# Patient Record
Sex: Male | Born: 1970 | Hispanic: No | Marital: Married | Smoking: Current every day smoker
Health system: Southern US, Community
[De-identification: ages and names within clinical notes are randomized; demographics above are authoritative.]

## PROBLEM LIST (undated history)

## (undated) DIAGNOSIS — R21 Rash and other nonspecific skin eruption: Secondary | ICD-10-CM

## (undated) DIAGNOSIS — Z8669 Personal history of other diseases of the nervous system and sense organs: Secondary | ICD-10-CM

## (undated) DIAGNOSIS — I2510 Atherosclerotic heart disease of native coronary artery without angina pectoris: Secondary | ICD-10-CM

## (undated) DIAGNOSIS — R42 Dizziness and giddiness: Secondary | ICD-10-CM

## (undated) DIAGNOSIS — E785 Hyperlipidemia, unspecified: Secondary | ICD-10-CM

## (undated) DIAGNOSIS — R9430 Abnormal result of cardiovascular function study, unspecified: Secondary | ICD-10-CM

## (undated) DIAGNOSIS — IMO0002 Reserved for concepts with insufficient information to code with codable children: Secondary | ICD-10-CM

## (undated) DIAGNOSIS — F17200 Nicotine dependence, unspecified, uncomplicated: Secondary | ICD-10-CM

## (undated) DIAGNOSIS — E669 Obesity, unspecified: Secondary | ICD-10-CM

## (undated) DIAGNOSIS — M940 Chondrocostal junction syndrome [Tietze]: Secondary | ICD-10-CM

## (undated) DIAGNOSIS — I10 Essential (primary) hypertension: Secondary | ICD-10-CM

## (undated) HISTORY — DX: Obesity, unspecified: E66.9

## (undated) HISTORY — DX: Atherosclerotic heart disease of native coronary artery without angina pectoris: I25.10

## (undated) HISTORY — DX: Chondrocostal junction syndrome (tietze): M94.0

## (undated) HISTORY — DX: Rash and other nonspecific skin eruption: R21

## (undated) HISTORY — DX: Personal history of other diseases of the nervous system and sense organs: Z86.69

## (undated) HISTORY — DX: Nicotine dependence, unspecified, uncomplicated: F17.200

## (undated) HISTORY — DX: Reserved for concepts with insufficient information to code with codable children: IMO0002

## (undated) HISTORY — DX: Hyperlipidemia, unspecified: E78.5

## (undated) HISTORY — DX: Abnormal result of cardiovascular function study, unspecified: R94.30

## (undated) HISTORY — DX: Dizziness and giddiness: R42

## (undated) HISTORY — DX: Essential (primary) hypertension: I10

---

## 2007-01-27 ENCOUNTER — Ambulatory Visit: Payer: Self-pay | Admitting: Cardiology

## 2010-07-11 ENCOUNTER — Encounter: Payer: Self-pay | Admitting: Cardiology

## 2010-07-12 ENCOUNTER — Encounter: Payer: Self-pay | Admitting: Cardiology

## 2010-07-12 ENCOUNTER — Encounter: Payer: Self-pay | Admitting: Cardiovascular Disease

## 2010-07-12 ENCOUNTER — Inpatient Hospital Stay (HOSPITAL_COMMUNITY)
Admission: AD | Admit: 2010-07-12 | Discharge: 2010-07-14 | DRG: 249 | Disposition: A | Discharge status: Home / Self Care | Payer: Self-pay | Source: Other Acute Inpatient Hospital | Attending: Internal Medicine | Admitting: Internal Medicine

## 2010-07-12 DIAGNOSIS — I2510 Atherosclerotic heart disease of native coronary artery without angina pectoris: Secondary | ICD-10-CM

## 2010-07-12 DIAGNOSIS — I2582 Chronic total occlusion of coronary artery: Secondary | ICD-10-CM | POA: Diagnosis present

## 2010-07-12 DIAGNOSIS — I214 Non-ST elevation (NSTEMI) myocardial infarction: Secondary | ICD-10-CM

## 2010-07-12 DIAGNOSIS — Z7982 Long term (current) use of aspirin: Secondary | ICD-10-CM

## 2010-07-12 DIAGNOSIS — I10 Essential (primary) hypertension: Secondary | ICD-10-CM | POA: Diagnosis present

## 2010-07-12 DIAGNOSIS — E785 Hyperlipidemia, unspecified: Secondary | ICD-10-CM | POA: Diagnosis present

## 2010-07-12 DIAGNOSIS — Z7902 Long term (current) use of antithrombotics/antiplatelets: Secondary | ICD-10-CM

## 2010-07-12 DIAGNOSIS — E669 Obesity, unspecified: Secondary | ICD-10-CM | POA: Diagnosis present

## 2010-07-12 DIAGNOSIS — F17200 Nicotine dependence, unspecified, uncomplicated: Secondary | ICD-10-CM | POA: Diagnosis present

## 2010-07-12 HISTORY — DX: Atherosclerotic heart disease of native coronary artery without angina pectoris: I25.10

## 2010-07-13 ENCOUNTER — Encounter: Payer: Self-pay | Admitting: Cardiology

## 2010-07-13 DIAGNOSIS — I219 Acute myocardial infarction, unspecified: Secondary | ICD-10-CM

## 2010-07-13 HISTORY — PX: CORONARY ANGIOPLASTY: SHX604

## 2010-07-13 HISTORY — PX: CARDIAC CATHETERIZATION: SHX172

## 2010-07-13 LAB — CBC
HCT: 37.8 % — ABNORMAL LOW (ref 39.0–52.0)
MCV: 87.1 fL (ref 78.0–100.0)
RBC: 4.34 MIL/uL (ref 4.22–5.81)
WBC: 13.2 10*3/uL — ABNORMAL HIGH (ref 4.0–10.5)

## 2010-07-13 LAB — BASIC METABOLIC PANEL
BUN: 10 mg/dL (ref 6–23)
CO2: 25 mEq/L (ref 19–32)
Chloride: 105 mEq/L (ref 96–112)
Glucose, Bld: 124 mg/dL — ABNORMAL HIGH (ref 70–99)
Potassium: 3.8 mEq/L (ref 3.5–5.1)

## 2010-07-14 ENCOUNTER — Encounter: Payer: Self-pay | Admitting: Cardiovascular Disease

## 2010-07-14 ENCOUNTER — Encounter: Payer: Self-pay | Admitting: Cardiology

## 2010-07-14 DIAGNOSIS — I214 Non-ST elevation (NSTEMI) myocardial infarction: Secondary | ICD-10-CM

## 2010-07-14 LAB — LIPID PANEL
Cholesterol: 146 mg/dL (ref 0–200)
HDL: 30 mg/dL — ABNORMAL LOW (ref >39)
LDL Cholesterol: 93 mg/dL (ref 0–99)
Total CHOL/HDL Ratio: 4.9 RATIO
Triglycerides: 114 mg/dL (ref <150)
VLDL: 23 mg/dL (ref 0–40)

## 2010-07-14 LAB — TSH: TSH: 4.94 u[IU]/mL — ABNORMAL HIGH (ref 0.350–4.500)

## 2010-07-17 ENCOUNTER — Telehealth (INDEPENDENT_AMBULATORY_CARE_PROVIDER_SITE_OTHER): Payer: Self-pay | Admitting: *Deleted

## 2010-07-19 ENCOUNTER — Telehealth (INDEPENDENT_AMBULATORY_CARE_PROVIDER_SITE_OTHER): Payer: Self-pay | Admitting: *Deleted

## 2010-07-20 ENCOUNTER — Encounter (INDEPENDENT_AMBULATORY_CARE_PROVIDER_SITE_OTHER): Payer: Self-pay | Admitting: Cardiology

## 2010-07-20 ENCOUNTER — Encounter: Payer: Self-pay | Admitting: Cardiology

## 2010-07-20 DIAGNOSIS — Z9861 Coronary angioplasty status: Secondary | ICD-10-CM

## 2010-07-20 DIAGNOSIS — G4733 Obstructive sleep apnea (adult) (pediatric): Secondary | ICD-10-CM | POA: Insufficient documentation

## 2010-07-20 DIAGNOSIS — I2510 Atherosclerotic heart disease of native coronary artery without angina pectoris: Secondary | ICD-10-CM

## 2010-07-25 NOTE — Medication Information (Signed)
Summary: [facility] MEDICATION SHEET ORDER  [facility] MEDICATION SHEET ORDER   Imported By: Zachary George 07/20/2010 04:54:09  _____________________________________________________________________  External Attachment:    Type:   Image     Comment:   External Document

## 2010-07-25 NOTE — Progress Notes (Signed)
Summary: PHONE: RASH  Phone Note Call from Patient Call back at [phone]   Caller: WIFE Call For: nurse Reason for Call: Acute Illness Details for Reason: RASH Summary of Call: Patient has developed a rash late yesterday. Rash is on arms,back and chest. thinks it is medication but not sure. Initial call taken by: Zachary George,  July 19, 2010 2:09 PM  Follow-up for Phone Call        spoke with patient said he is having a rash little red bumps on right forearm,chest,&entire back. Took some benedryl and it clears until benedryl wears off. Rash started yesterday. forearms started itching Tuesday a little. Follow-up by: Carlye Grippe,  July 19, 2010 3:40 PM  Additional Follow-up for Phone Call Additional follow up Details #1::        I do not know this patient. Records indicate that he was discharged from [facility] and was to followup with Dr. Andee Lineman. He had a non-STEMI and underwent stent placement to the circumflex. I would be concerned that this could be a drug allergy, and he is on Plavix, presumably a new medication. I would have him seen tomorrow in clinic to sort this out. I spoke with Gene Serpe about the situation. Additional Follow-up by: Loreli Slot, MD, [facility],  July 19, 2010 4:34 PM   New Allergies: ! CODEINE Additional Follow-up for Phone Call Additional follow up Details #2::    appears that patient has been added to today's schedule. Follow-up by: Carlye Grippe,  July 20, 2010 8:14 AM  New/Updated Medications: PLAVIX 75 MG TABS (CLOPIDOGREL BISULFATE) Take 1 tablet by mouth once a day PRAVASTATIN SODIUM 40 MG TABS (PRAVASTATIN SODIUM) Take 1 tablet by mouth once a day at bedtime PEPCID AC MAXIMUM STRENGTH 20 MG TABS (FAMOTIDINE) Take 1 tablet by mouth once a day New Allergies: ! CODEINE

## 2010-07-25 NOTE — Cardiovascular Report (Signed)
Summary: Cardiac Catheterization  Cardiac Catheterization   Imported By: Dorise Hiss 07/20/2010 08:07:26  _____________________________________________________________________  External Attachment:    Type:   Image     Comment:   External Document

## 2010-07-25 NOTE — Consult Note (Signed)
Summary: Consultation Report/ CARDIOLOGY  Consultation Report/ CARDIOLOGY   Imported By: Dorise Hiss 07/20/2010 09:20:21  _____________________________________________________________________  External Attachment:    Type:   Image     Comment:   External Document

## 2010-07-25 NOTE — Letter (Addendum)
Summary: Discharge Summary  Discharge Summary   Imported By: Dorise Hiss 07/17/2010 16:13:22  _____________________________________________________________________  External Attachment:    Type:   Image     Comment:   External Document

## 2010-07-25 NOTE — Progress Notes (Signed)
Summary: Cath Dressing  Phone Note Call from Patient   Summary of Call: Pt's wife called stating he still have bluky, gauze dressing in place to groin and arm following cath on 2/9. He would like to know when these can be removed. Pt's wife notified pt can remove any gauze dressing covering cath insertion site at this time. Initial call taken by: Cyril Loosen, RN, BSN,  July 17, 2010 4:04 PM

## 2010-07-31 NOTE — Cardiovascular Report (Addendum)
Summary: [facility] Cardiac Cath   [facility] Cardiac Cath   Imported By: Earl Many 07/26/2010 15:49:47  _____________________________________________________________________  External Attachment:    Type:   Image     Comment:   External Document

## 2010-08-02 NOTE — Discharge Summary (Signed)
  NAMEMEDARDO, Alexander Shannon                ACCOUNT NO.:  [id number]  MEDICAL RECORD NO.:  [id number]           PATIENT TYPE:  LOCATION:                                 FACILITY:  PHYSICIAN:  Vesta Mixer, M.D. DATE OF BIRTH:  03/25/1971  DATE OF ADMISSION: DATE OF DISCHARGE:                              DISCHARGE SUMMARY   ADDENDUM  The patient's medications will be changed at discharge.  Instead of giving him Crestor to go home on, he will be given pravastatin 40 mg nightly, #30, with 5 refills due to cost.  He will need a followup lipid and LFT panel in about 8 weeks and this can be arranged in his followup visit at our office in [place].     Tereso Newcomer, PA-C   ______________________________ Vesta Mixer, M.D.    SW/MEDQ  D:  07/14/2010  T:  07/15/2010  Job:  [id number]  Electronically Signed by Tereso Newcomer PA-C on 07/30/2010 08:37:06 AM Electronically Signed by Kristeen Miss M.D. on 08/02/2010 06:15:55 PM

## 2010-08-02 NOTE — Discharge Summary (Signed)
Alexander Shannon, Alexander Shannon                ACCOUNT NO.:  [id number]  MEDICAL RECORD NO.:  [id number]           PATIENT TYPE:  I  LOCATION:  3701                         FACILITY:  [facility]  PHYSICIAN:  Vesta Mixer, M.D. DATE OF BIRTH:  December 29, 1970  DATE OF ADMISSION:  07/12/2010 DATE OF DISCHARGE:  07/14/2010                              DISCHARGE SUMMARY   CARDIOLOGIST:  Learta Codding, MD,FACC  ADMISSION DIAGNOSIS:  Non-ST-elevation myocardial infarction.  DISCHARGE DIAGNOSES: 1. Coronary artery disease.     a.     Status post bare-metal stent to the circumflex in the      setting of non-ST-elevation myocardial infarction this admission. 2. Preserved left ventricular function with an ejection fraction of 60-     65% with mild left ventricular hypertrophy on echocardiogram this     admission. 3. Dyslipidemia. 4. Hypertension. 5. Tobacco abuse. 6. Obesity. 7. Migraine headaches. 8. Family history of coronary disease.  PROCEDURES PERFORMED THIS ADMISSION:  Cardiac catheterization and percutaneous coronary intervention by Dr. Tonny Bollman July 12, 2010:  Proximal RCA 20-30%; posterolateral branch 50%; proximal LAD 30%, distal LAD, 50%; circumflex totally occluded in the mid vessel.  PCI: 3.0 x 18 mm Vision bare-metal stent placed in circumflex.  ALLERGIES:  CODEINE.  ADMISSION HISTORY:  Alexander Shannon is a 40 year old male who presented to [facility] on February 7 with complaints of chest and jaw pain. He was seen by cardiology and subsequently ruled in for myocardial infarction and was transferred to [facility] for further evaluation and treatment.  HOSPITAL COURSE:  The patient was transferred July 12, 2010.  He was seen by Dr. Excell Seltzer who performed cardiac catheterization, percutaneous coronary intervention.  The patient underwent bare-metal stenting to the circumflex.  There were no immediate complications.  Post procedure, Dr. Excell Seltzer recommended the  patient be on Plavix and aspirin for 12 months minimum.  Followup echocardiogram demonstrated normal LV function with an EF of 60-65%.  He was placed on Crestor and low-dose beta-blocker. He was seen by the tobacco cessation team and they recommended nicotine patch.  The patient ambulated with cardiac rehab without problems.  On July 14, 2010, he was found to be stable and was discharged to home in good condition.  LABS AND ANCILLARY DATA:  Echocardiogram as outlined above.  Hemoglobin 12.2, platelet count 245,000, potassium 3.8, creatinine 1.05, total cholesterol 146, triglycerides 114, HDL 30, LDL 93.  TSH 4.940. Abnormal TSH.  ACTIVITIES:  Increase activity slowly.  No driving for 1 week.  No lifting or sexual activity for 2 weeks.  He can return to work in 2 weeks on July 30, 2010.  He has been provided with a note.  DIET:  Low-fat, low-sodium diet.  WOUND CARE:  He is to call our office for any groin swelling, bleeding, bruising, or fever.  FOLLOWUP: 1. He is to see Dr. Andee Lineman in the 2 weeks in the [place] office.  The     office will contact with an appointment. 2. He will need a followup TSH and free T4 at his followup visit in 2  weeks.  It should be noted the patient listed Nexium upon admission.  However, he was taking his mother's prescription as needed.  He has been given instructions to take Pepcid 20 mg twice a day, over-the-counter as needed for indigestion, and to not take Nexium.  Total physician PA time greater than 30 minutes and was discharged.     Tereso Newcomer, PA-C   ______________________________ Vesta Mixer, M.D.    SW/MEDQ  D:  07/14/2010  T:  07/14/2010  Job:  [id number]  cc:   Learta Codding, MD,FACC  Electronically Signed by Tereso Newcomer PA-C on 07/30/2010 08:36:50 AM Electronically Signed by Kristeen Miss M.D. on 08/02/2010 06:15:53 PM

## 2010-08-03 ENCOUNTER — Telehealth (INDEPENDENT_AMBULATORY_CARE_PROVIDER_SITE_OTHER): Payer: Self-pay | Admitting: *Deleted

## 2010-08-08 ENCOUNTER — Encounter: Payer: Self-pay | Admitting: Cardiology

## 2010-08-09 NOTE — Procedures (Signed)
NAMERONON, FERGER                ACCOUNT NO.:  [id number]  MEDICAL RECORD NO.:  [id number]           PATIENT TYPE:  I  LOCATION:  2910                         FACILITY:  [facility]  PHYSICIAN:  Veverly Fells. Excell Seltzer, MD  DATE OF BIRTH:  1970-12-19  DATE OF PROCEDURE:  07/12/2010 DATE OF DISCHARGE:                           CARDIAC CATHETERIZATION   PROCEDURE: 1. Selective coronary angiography. 2. Percutaneous transluminal coronary angioplasty and stenting of the     left circumflex.  PROCEDURAL INDICATIONS:  Mr. Demario is a 40 year old gentleman who presented with non-ST-elevation infarction.  He had ongoing prolonged chest pain with elevated cardiac markers and was referred for PCI on an emergent basis.  Risks and indications of procedure were reviewed with the patient. Informed consent was obtained.  The right wrist was prepped, draped, and anesthetized with 1% lidocaine.  Using modified Seldinger technique, a 6- French sheath was placed in the right radial artery.  Weight-based unfractionated heparin was administered intravenously, 3 mg of verapamil was administered through the arterial sheath.  Coronary imaging was performed with standard Judkins catheters.  I had very difficult time imaging the left coronary artery.  Initially, I was able to take preliminary images with a 6-French XB LAD 3.5-cm guide catheter, but the catheter position was precarious.  The patient was found to have a totally occluded left circumflex and PCI was planned.  Bivalirudin was started for anticoagulation.  The patient had already been preloaded with Plavix.  I went through multiple guide catheters and never could get anything to fit into the left coronary artery from the right radial artery.  A prolonged attempt was made and eventually I thought the only way to treat this lesion successfully would be to convert to femoral access.  The femoral artery was accessed with a front wall puncture. The same  6-French XB LAD 3.5-cm guide catheter was used and it fit into the coronary well.  The totally occluded circumflex was crossed with a Cougar guidewire and reperfusion occurred with the wire crossing the lesion.  The vessel was predilated with a 2.5 x 15-mm apex balloon which was taken to 6 atmospheres.  The vessel was then stented with a 3.0 x 18- mm Vision bare-metal stent which was deployed at 14 atmospheres and appeared well expanded.  The stent was postdilated with a 3.5 x 15-mm [place] Quantum apex balloon which was taken to 14 atmospheres with an excellent result.  There was TIMI 3 flow and 0% residual stenosis postprocedure. Preprocedure, there was 100% stenosis and TIMI 0 flow.  The patient tolerated the procedure well.  Left ventriculography was deferred because of high contrast volume.  The femoral arteriotomy was closed with an Angio-Seal.  A TR band was used for radial artery hemostasis.  PROCEDURAL FINDINGS:  Right coronary artery:  This vessel was dominant. There is scattered plaque throughout.  There are no areas of high-grade stenosis noted, but there is 20-30% disease throughout the proximal and mid vessel.  Distally, the RCA divides into a PDA and a posterolateral branch.  The PDA is patent with minor irregularity.  The posterolateral branch has 50% stenosis  and appears to be diffusely diseased.  Left coronary artery:  The left main is patent.  It divides into the LAD and left circumflex.  LAD:  The LAD is patent throughout its course.  It wraps around the LV apex.  There is 30% proximal stenosis and 50% distal stenosis in the LAD.  The first diagonal branch is widely patent.  Left circumflex:  The left circumflex gives off 2 small obtuse marginal branches proximally.  The circumflex is then totally occluded in the midportion with TIMI 0 flow and no collateral filling.  After reopening the circumflex, it gives off a large OM branch and an OM-2 territory.  FINAL  ASSESSMENT: 1. Nonobstructive left anterior descending and right coronary artery     stenoses. 2. Total occlusion of the mid left circumflex with successful     percutaneous coronary intervention using a bare-metal stent     platform.  RECOMMENDATIONS:  The patient should continue on dual antiplatelet therapy with aspirin and Plavix for 12 months.  We will check a 2-D echo to assess his LV function.  He will require aggressive risk reduction measures.     Veverly Fells. Excell Seltzer, MD     MDC/MEDQ  D:  07/12/2010  T:  07/13/2010  Job:  [id number]  cc:   Learta Codding, MD,FACC  Electronically Signed by Tonny Bollman MD on 08/07/2010 08:56:01 PM

## 2010-08-14 NOTE — Progress Notes (Signed)
Summary: PEPCID [facility] INEFFECTIVE  Phone Note Call from Patient Call back at [phone]   Caller: Spouse Call For: doctor Summary of Call: Message left on nurses voicemail that pepcid ac is no longer working and patient is having increased heartburn and wanted to know if MD could recommend or change to something else. Initial call taken by: Carlye Grippe,  August 03, 2010 4:02 PM  Follow-up for Phone Call        change to Protonix 40mg  pO qdaily.  Follow-up by: Lewayne Bunting, MD, [facility],  August 07, 2010 6:24 AM  Additional Follow-up for Phone Call Additional follow up Details #1::        left message on machine to call office.  Additional Follow-up by: Carlye Grippe,  August 07, 2010 1:27 PM    Additional Follow-up for Phone Call Additional follow up Details #2::    Patient's wife informed of the above.  Follow-up by: Carlye Grippe,  August 08, 2010 8:24 AM  New/Updated Medications: PROTONIX 40 MG SOLR (PANTOPRAZOLE SODIUM) Take 1 tablet by mouth once a day Prescriptions: PROTONIX 40 MG SOLR (PANTOPRAZOLE SODIUM) Take 1 tablet by mouth once a day  #30 x 3   Entered by:   Carlye Grippe   Authorized by:   Lewayne Bunting, MD, [facility]   Signed by:   Carlye Grippe on 08/08/2010   Method used:   Electronically to        [facility]* (retail)       [address]       [place], [place]  [address]       Ph: [phone]       Fax: [phone]   RxID:   [id number]

## 2010-08-14 NOTE — Assessment & Plan Note (Signed)
Summary: per Dr. Jenetta Downer to have check with a rash that developed...   Visit Type:  Follow-up Primary Provider:  none  CC:  rash on arms and torso.  History of Present Illness: the patient had a recent stent placed for an acute coronary syndrome. He was placed on clopidogrel. However he has developed a rash. He seen in the office for this problem. The patient describes papular rash predominantly on the extremities in particular the upper arms. The rash is itchy. He has no other symptoms. He has no recurrent chest pain short of breath orthopnea PND.  Preventive Screening-Counseling & Management  Alcohol-Tobacco     Smoking Status: current     Smoking Cessation Counseling: yes     Packs/Day: 1/4 PPD  Current Medications (verified): 1)  Effient 10 Mg Tabs (Prasugrel Hcl) .... Take 1 Tablet By Mouth Once A Day 2)  Pravastatin Sodium 40 Mg Tabs (Pravastatin Sodium) .... Take 1 Tablet By Mouth Once A Day At Bedtime 3)  Pepcid Ac Maximum Strength 20 Mg Tabs (Famotidine) .... Take 1 Tablet By Mouth Once A Day 4)  Bisoprolol Fumarate 5 Mg Tabs (Bisoprolol Fumarate) .... Take 1/2 Tablet By Mouth Once A Day 5)  Aspir-Low 81 Mg Tbec (Aspirin) .... Take 1 Tablet By Mouth Once A Day 6)  Benadryl 25 Mg Tabs (Diphenhydramine Hcl) .... As Needed 7)  Effient 10 Mg Tabs (Prasugrel Hcl) .... Take 1 Tablet By Mouth Once A Day  (5 Bottles / 7 Tabs Each)  Allergies (verified): 1)  ! Codeine  Comments:  Nurse/Medical Assistant: The patient's medication bottles and allergies were reviewed with the patient and were updated in the Medication and Allergy Lists.  Past History:  Past Medical History: Last updated: 07/20/2010 1. Coronary artery disease.       a.     Status post bare-metal stent to the circumflex in the        setting of non-ST-elevation myocardial infarction this admission.   2. Preserved left ventricular function with an ejection fraction of 60-       65% with mild left ventricular  hypertrophy on echocardiogram this       admission.   3. Dyslipidemia.   4. Hypertension.   5. Tobacco abuse.   6. Obesity.   7. Migraine headaches.   8. Family history of coronary disease.      Family History: Last updated: 07/20/2010 Family hx of CAD Father had Coronary artery bypass grafting and Leriche syndrome  Social History: Last updated: 07/20/2010 drinks beer occasionally smokes cigarettes appr a pack a day stated smoking age 69  Risk Factors: Smoking Status: current (07/20/2010) Packs/Day: 1/4 PPD (07/20/2010)  Social History: Smoking Status:  current Packs/Day:  1/4 PPD  Review of Systems       The patient complains of rash.  The patient denies fatigue, malaise, fever, weight gain/loss, vision loss, decreased hearing, hoarseness, chest pain, palpitations, shortness of breath, prolonged cough, wheezing, sleep apnea, coughing up blood, abdominal pain, blood in stool, nausea, vomiting, diarrhea, heartburn, incontinence, blood in urine, muscle weakness, joint pain, leg swelling, skin lesions, headache, fainting, dizziness, depression, anxiety, enlarged lymph nodes, easy bruising or bleeding, and environmental allergies.    Vital Signs:  Patient profile:   40 year old male Height:      66 inches Weight:      303 pounds BMI:     49.08 Pulse rate:   62 / minute BP sitting:   104 / 73  (left  arm) Cuff size:   large  Vitals Entered By: Carlye Grippe (July 20, 2010 11:43 AM)  Nutrition Counseling: Patient's BMI is greater than 25 and therefore counseled on weight management options. CC: rash on arms and torso   Physical Exam  Additional Exam:  General: Well-developed, well-nourished in no distress head: Normocephalic and atraumatic eyes PERRLA/EOMI intact, conjunctiva and lids normal nose: No deformity or lesions mouth normal dentition, normal posterior pharynx neck: Supple, no JVD.  No masses, thyromegaly or abnormal cervical nodes lungs: Normal breath  sounds bilaterally without wheezing.  Normal percussion heart: regular rate and rhythm with normal S1 and S2, no S3 or S4.  PMI is normal.  No pathological murmurs abdomen: Normal bowel sounds, abdomen is soft and nontender without masses, organomegaly or hernias noted.  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 Doing well. His updated medication list for this problem includes:    Effient 10 Mg Tabs (Prasugrel hcl) .Marland Kitchen... Take 1 tablet by mouth once a day    Bisoprolol Fumarate 5 Mg Tabs (Bisoprolol fumarate) .Marland Kitchen... Take 1/2 tablet by mouth once a day    Aspir-low 81 Mg Tbec (Aspirin) .Marland Kitchen... Take 1 tablet by mouth once a day    Effient 10 Mg Tabs (Prasugrel hcl) .Marland Kitchen... Take 1 tablet by mouth once a day  (5 bottles / 7 tabs each)  Problem # 2:  SKIN RASH (ICD-782.1) the patient appears allergic to Plavix. We will change him to prasugrel. He will be started on 10 mg p.o. q. daily. The patient is to call back if he has any further problems.  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) Assessment: Comment Only  Patient Instructions: 1)  Stop Plavix 2)  Begin Effient 10mg  daily 3)  Follow up in  1 month Prescriptions: EFFIENT 10 MG TABS (PRASUGREL HCL) Take 1 tablet by mouth once a day  (5 bottles / 7 tabs each)  #35 x 0   Entered by:   Hoover Brunette, LPN   Authorized by:   Lewayne Bunting, MD, [facility]   Signed by:   Lewayne Bunting, MD, [facility] on 07/20/2010   Method used:   Samples Given   RxID:   [id number] EFFIENT 10 MG TABS (PRASUGREL  HCL) Take 1 tablet by mouth once a day  #30 x 6   Entered by:   Hoover Brunette, LPN   Authorized by:   Lewayne Bunting, MD, [facility]   Signed by:   Hoover Brunette, LPN on 14/78/2956   Method used:   Print then Give to Patient   RxID:   [id number]   Handout requested.

## 2010-08-31 ENCOUNTER — Encounter: Payer: Self-pay | Admitting: Cardiology

## 2010-09-03 ENCOUNTER — Encounter: Payer: Self-pay | Admitting: Cardiology

## 2010-11-07 ENCOUNTER — Encounter: Payer: Self-pay | Admitting: Cardiology

## 2010-11-15 ENCOUNTER — Encounter: Payer: Self-pay | Admitting: Cardiovascular Disease

## 2010-11-15 ENCOUNTER — Ambulatory Visit (INDEPENDENT_AMBULATORY_CARE_PROVIDER_SITE_OTHER): Payer: Self-pay | Admitting: Cardiovascular Disease

## 2010-11-15 DIAGNOSIS — F17200 Nicotine dependence, unspecified, uncomplicated: Secondary | ICD-10-CM

## 2010-11-15 DIAGNOSIS — E785 Hyperlipidemia, unspecified: Secondary | ICD-10-CM

## 2010-11-15 DIAGNOSIS — Z720 Tobacco use: Secondary | ICD-10-CM | POA: Insufficient documentation

## 2010-11-15 DIAGNOSIS — I2510 Atherosclerotic heart disease of native coronary artery without angina pectoris: Secondary | ICD-10-CM

## 2010-11-15 NOTE — Assessment & Plan Note (Signed)
Continue pravastatin 40 mg once daily. This was chosen due to cost reasons. He did have his lipid profile checked with his primary care physician recently and was told that it was fine. These results are not available. The goal LDL is less than 70.

## 2010-11-15 NOTE — Assessment & Plan Note (Signed)
The patient is doing well at this time with no clear symptoms of angina, heart failure or arrhythmia. The plan is to continue dual antiplatelet therapy for one year until February of 2013. At that time, Effient can likely be stopped. Continue aspirin daily indefinitely. I had a prolonged discussion with him about the importance of lifestyle changes including diet and regular exercise.

## 2010-11-15 NOTE — Assessment & Plan Note (Signed)
I had a prolonged discussion with the patient about the importance of smoking cessation. Unfortunately, he does not seem to be determined at this time to quit. Would continue to discuss this with him up on followup.

## 2010-11-15 NOTE — Patient Instructions (Signed)
Your physician wants you to follow-up in: 6 months. You will receive a reminder letter in the mail one-two months in advance. If you don't receive a letter, please call our office to schedule the follow-up appointment. Your physician recommends that you continue on your current medications as directed. Please refer to the Current Medication list given to you today. Your physician discussed the hazards of tobacco use. Tobacco use cessation is recommended and techniques and options to help you quit were discussed. 

## 2010-11-15 NOTE — Progress Notes (Signed)
HPI  This is a 40 year old man who is here today for a followup visit. 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 Cardiac catheterization showed an occluded mid left circumflex. He underwent an angioplasty and a bare-metal stent placement without complications. He was placed on Plavix. However, subsequently he came with a rash which was thought to be due to Plavix. This was switched to Effient 10 mg once daily. His rash has resolved completely. He has been doing reasonably well. He gets rare episodes of left-sided chest aching without radiation which happens mostly at night at rest. He does not have any exertional symptoms. He is very active at work. Unfortunately, he continues to smoke he has been taking all his other medications.   Allergies  Allergen Reactions  . Codeine      Current Outpatient Prescriptions on File Prior to Visit  Medication Sig Dispense Refill  . aspirin 81 MG tablet Take 81 mg by mouth daily.        . bisoprolol (ZEBETA) 5 MG tablet Take 1/2 by mouth daily       . diphenhydrAMINE (BENADRYL) 25 MG tablet Take 25 mg by mouth every 6 (six) hours as needed.        . prasugrel (EFFIENT) 10 MG TABS Take by mouth daily.        . pravastatin (PRAVACHOL) 40 MG tablet Take 40 mg by mouth daily.        Marland Kitchen DISCONTD: pantoprazole (PROTONIX) 40 MG tablet Take 40 mg by mouth daily.           Past Medical History  Diagnosis Date  . Chest pain, unspecified   . Tobacco use disorder   . Obesity, unspecified   . Tietze's disease   . History of migraine headaches     Takes excedrin  . Dyslipidemia   . Essential hypertension, benign   . CAD (coronary artery disease) 07/12/2010        CARDIAC CATHETERIZATION,  Status post bare-metal stent to the circumflex   . Hyperlipidemia      Past Surgical History  Procedure Date  . Cardiac catheterization 07/13/2010    Occludded mid LCX, LAD: 30% proximal and 50% distal, RCA 30%. Normal EF by echo  . Coronary angioplasty 07/13/2010    Mid LCX: 3.0 X 18 mm Vision BME     Family History  Problem Relation Age of Onset  . Coronary artery disease Other     family history      History   Social History  . Marital Status: Single    Spouse Name: N/A    Number of Children: N/A  . Years of Education: N/A   Occupational History  . Not on file.   Social History Main Topics  . Smoking status: Current Everyday Smoker -- 0.5 packs/day for 25 years    Types: Cigarettes  . Smokeless tobacco: Never Used   Comment: SMOKING CESSATION COUNSELING Started smoking age 78   . Alcohol Use: 0.0 oz/week     drinks beer on occasion  . Drug Use: Not on file  . Sexually Active: Not on file   Other Topics Concern  . Not on file   Social History Narrative  . No narrative on file       PHYSICAL EXAM   BP 128/78  Pulse 54  Ht 5\' 6"  (1.676 m)  Wt 287 lb (130.182 kg)  BMI 46.32 kg/m2  Constitutional: He is oriented to person, place, and time. He appears well-developed  and well-nourished. No distress.  HENT: No nasal discharge.  Head: Normocephalic and atraumatic.  Eyes: Pupils are equal, round, and reactive to light. Right eye exhibits no discharge. Left eye exhibits no discharge.  Neck: Normal range of motion. Neck supple. No JVD present. No thyromegaly present.  Cardiovascular: Normal rate, regular rhythm, normal heart sounds and intact distal pulses. Exam reveals no gallop and no friction rub.  No murmur heard.  Pulmonary/Chest: Effort normal and breath sounds normal. No stridor. No respiratory distress. He has no wheezes. He has no rales. He exhibits no tenderness.  Abdominal: Soft. Bowel sounds are normal. He exhibits no distension. There is no tenderness. There is no rebound and no guarding.  Musculoskeletal: Normal range of motion. He exhibits no edema and no tenderness.  Neurological: He is alert and oriented to person, place, and time. Coordination normal.  Skin: Skin is warm and dry. No rash noted. He is not  diaphoretic. No erythema. No pallor.  Psychiatric: He has a normal mood and affect. His behavior is normal. Judgment and thought content normal.        ASSESSMENT AND PLAN

## 2011-01-07 ENCOUNTER — Telehealth: Payer: Self-pay | Admitting: Nurse Practitioner

## 2011-01-07 NOTE — Telephone Encounter (Signed)
Pt called this evening stating that yesterday he did a fair amount of yard work, including weed whacking for about an hour.  Today, he's noted some lower chest 'soreness' and tenderness.  This discomfort is worse w/ palpation as well as position changes like lifting his arms above his head.  He says it feels like a sore muscle and is nothing like his prior angina. He has had no associated Ss and was able to work 9hrs today w/o any difficulty.  I advised that over the phone it is difficult to accurately assess chest pain but that based on his description, his Ss sound msk in origin.  He's going to take OTC ibuprofen to see if this helps.  If not, or if Ss worsen, he will either call back in am or present to [facility] ER.

## 2011-02-15 ENCOUNTER — Other Ambulatory Visit: Payer: Self-pay | Admitting: Physician Assistant

## 2011-04-19 ENCOUNTER — Encounter: Payer: Self-pay | Admitting: Cardiology

## 2011-04-20 ENCOUNTER — Other Ambulatory Visit: Payer: Self-pay | Admitting: Physician Assistant

## 2011-06-10 ENCOUNTER — Encounter: Payer: Self-pay | Admitting: Cardiology

## 2011-06-10 ENCOUNTER — Ambulatory Visit (INDEPENDENT_AMBULATORY_CARE_PROVIDER_SITE_OTHER): Payer: Self-pay | Admitting: Physician Assistant

## 2011-06-10 DIAGNOSIS — Z720 Tobacco use: Secondary | ICD-10-CM

## 2011-06-10 DIAGNOSIS — I2510 Atherosclerotic heart disease of native coronary artery without angina pectoris: Secondary | ICD-10-CM

## 2011-06-10 DIAGNOSIS — F17200 Nicotine dependence, unspecified, uncomplicated: Secondary | ICD-10-CM

## 2011-06-10 DIAGNOSIS — E785 Hyperlipidemia, unspecified: Secondary | ICD-10-CM

## 2011-06-10 NOTE — Progress Notes (Signed)
HPI: Patient presents for scheduled follow up.  Since last OV with Dr Kirke Corin in June, pt denies symptoms suggestive of UAP. He is compliant with his meds, including Effient, works regularly, and has lost 9 lbs. Unfortunately, however, he continues to smoke.  He has some mild DOE, and also reports occasional brief episodes of CP, which are unpredictable in onset. He denies symptoms reminiscent of those which preceded his MI in February 2012.  Allergies  Allergen Reactions  . Codeine   . Plavix (Clopidogrel Bisulfate) Rash    Current Outpatient Prescriptions  Medication Sig Dispense Refill  . aspirin 81 MG tablet Take 81 mg by mouth daily.        . bisoprolol (ZEBETA) 5 MG tablet TAKE 1/2 TABLET BY MOUTH DAILY  15 tablet  6  . celecoxib (CELEBREX) 200 MG capsule Take 200 mg by mouth daily.        . prasugrel (EFFIENT) 10 MG TABS Take by mouth daily.        . pravastatin (PRAVACHOL) 40 MG tablet TAKE 1 TABLET BY MOUTH AT BEDTIME  30 tablet  6    Past Medical History  Diagnosis Date  . Chest pain, unspecified   . Tobacco use disorder   . Obesity, unspecified   . Tietze's disease   . History of migraine headaches     Takes excedrin  . Dyslipidemia   . Essential hypertension, benign   . CAD (coronary artery disease) 07/12/2010        CARDIAC CATHETERIZATION,  Status post bare-metal stent to the circumflex   . Hyperlipidemia     History   Social History  . Marital Status: Single    Spouse Name: N/A    Number of Children: N/A  . Years of Education: N/A   Occupational History  . Not on file.   Social History Main Topics  . Smoking status: Current Everyday Smoker -- 0.5 packs/day for 25 years    Types: Cigarettes  . 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 Alcohol Use: 0.0 oz/week     drinks beer on occasion  . Drug Use: Not on file  . Sexually Active: Not on file   Other Topics Concern  . Not on file   Social  History Narrative  . No narrative on file    Family History  Problem Relation Age of Onset  . Coronary artery disease Other     family history     ROS: no nausea, vomiting; no fever, chills; no melena, hematochezia; no claudication  PHYSICAL EXAM:  BP 138/86  Pulse 68  Ht 5\' 6"  (1.676 m)  Wt 278 lb 12.8 oz (126.463 kg)  BMI 45.00 kg/m2 GENERAL: 40 yom, obese, sitting upright; NAD HEENT: NCAT, PERRLA, EOMI; sclera clear; no xanthelasma NECK: palpable bilateral carotid pulses, no bruits; no JVD; no TM LUNGS: CTA bilaterally CARDIAC: RRR (S1, S2); no significant murmurs; no rubs or gallops ABDOMEN: protuberant EXTREMETIES: intact distal pulses; no significant peripheral edema SKIN: warm/dry; no obvious rash/lesions MUSCULOSKELETAL: no joint deformity NEURO: no focal deficit; NL affect   EKG:    ASSESSMENT & PLAN:

## 2011-06-10 NOTE — Patient Instructions (Signed)
Your physician wants you to follow-up in: 6 months. You will receive a reminder letter in the mail one-two months in advance. If you don't receive a letter, please call our office to schedule the follow-up appointment. Your physician recommends that you continue on your current medications as directed. Please refer to the Current Medication list given to you today. Your physician discussed the hazards of tobacco use. Tobacco use cessation is recommended and techniques and options to help you quit were discussed. CALL 1-800-QUIT-NOW Your physician discussed the importance of regular exercise and recommended that you start or continue a regular exercise program for good health.

## 2011-06-10 NOTE — Assessment & Plan Note (Signed)
Continue current medication regimen, including Effient. We'll reassess clinical status in 6 months. Patient encouraged to exercise regularly, and to quit smoking.

## 2011-06-10 NOTE — Assessment & Plan Note (Signed)
We'll request most recent lipid profile results from his primary care provider, at [facility]. Reemphasized the importance of maintaining LDL target 70 or less, if feasible. We'll continue current dose of Pravachol, pending review of these results.

## 2011-06-10 NOTE — Assessment & Plan Note (Signed)
Reemphasized the importance of smoking cessation. Provided patient with 1 [phone] QUIT NOW number, for assistance.

## 2011-06-12 ENCOUNTER — Other Ambulatory Visit: Payer: Self-pay | Admitting: *Deleted

## 2011-06-12 ENCOUNTER — Ambulatory Visit: Payer: Self-pay | Admitting: Cardiology

## 2011-06-12 MED ORDER — PRAVASTATIN SODIUM 80 MG PO TABS: 80.0000 mg | ORAL_TABLET | Freq: Every day | ORAL | Status: DC

## 2011-12-16 ENCOUNTER — Ambulatory Visit: Payer: Self-pay | Admitting: Cardiology

## 2012-01-10 ENCOUNTER — Encounter: Payer: Self-pay | Admitting: *Deleted

## 2012-01-10 ENCOUNTER — Encounter: Payer: Self-pay | Admitting: Cardiovascular Disease

## 2012-01-10 ENCOUNTER — Ambulatory Visit (INDEPENDENT_AMBULATORY_CARE_PROVIDER_SITE_OTHER): Payer: BC Managed Care – PPO | Admitting: Cardiovascular Disease

## 2012-01-10 VITALS — BP 112/70 | HR 69 | Ht 66.0 in | Wt 281.0 lb

## 2012-01-10 DIAGNOSIS — E785 Hyperlipidemia, unspecified: Secondary | ICD-10-CM

## 2012-01-10 DIAGNOSIS — I2510 Atherosclerotic heart disease of native coronary artery without angina pectoris: Secondary | ICD-10-CM

## 2012-01-10 DIAGNOSIS — F17200 Nicotine dependence, unspecified, uncomplicated: Secondary | ICD-10-CM

## 2012-01-10 DIAGNOSIS — R079 Chest pain, unspecified: Secondary | ICD-10-CM

## 2012-01-10 NOTE — Assessment & Plan Note (Signed)
Continues to have periods of chest pain with exertion.  Still smoking  Stress myovue  If nonischemic stop Effient

## 2012-01-10 NOTE — Progress Notes (Signed)
Patient ID: Alexander Shannon, male   DOB: June 13, 1970, 41 y.o.   MRN: [id number] This is a 41 year old man who is here today for a followup visit. 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 Cardiac catheterization showed an occluded mid left circumflex. He underwent an angioplasty and a bare-metal stent placement without complications. He was placed on Plavix. However, subsequently he came with a rash which was thought to be due to Plavix. This was switched to Effient 10 mg once daily. His rash has resolved completely. He has been doing reasonably well.  He gets some SSCP with heavy exertion mosty going up stairs multiple times.  Apparently he had some mild SSCP when he last saw Dr Kirke Corin and this is why Effient not stopped   Unfortunately, he continues to smoke he has been taking all his other medications.    ROS: Denies fever, malais, weight loss, blurry vision, decreased visual acuity, cough, sputum, SOB, hemoptysis, pleuritic pain, palpitaitons, heartburn, abdominal pain, melena, lower extremity edema, claudication, or rash.  All other systems reviewed and negative  General: Affect appropriate Obese white male HEENT: normal Neck supple with no adenopathy JVP normal no bruits no thyromegaly Lungs clear with no wheezing and good diaphragmatic motion Heart:  S1/S2 no murmur, no rub, gallop or click PMI normal Abdomen: benighn, BS positve, no tenderness, no AAA no bruit.  No HSM or HJR Distal pulses intact with no bruits No edema Neuro non-focal Skin warm and dry tatoos on arm and sholders No muscular weakness   Current Outpatient Prescriptions  Medication Sig Dispense Refill  . aspirin 81 MG tablet Take 81 mg by mouth daily.        . bisoprolol (ZEBETA) 5 MG tablet TAKE 1/2 TABLET BY MOUTH DAILY  15 tablet  6  . prasugrel (EFFIENT) 10 MG TABS Take by mouth daily.        . pravastatin (PRAVACHOL) 80 MG tablet Take 1 tablet (80 mg total) by mouth daily.  30 tablet  6     Allergies  Plavix  Electrocardiogram:  NSR rate 69 poor R wave progression  Assessment and Plan

## 2012-01-10 NOTE — Assessment & Plan Note (Signed)
Discussed use of e cig and nicorette gum  Risk of recurrent vascular disease and MI discussed

## 2012-01-10 NOTE — Assessment & Plan Note (Signed)
Cholesterol is at goal.  Continue current dose of statin and diet Rx.  No myalgias or side effects.  F/U  LFT's in 6 months. Lab Results  Component Value Date   LDLCALC  Value: 9        Total Cholesterol/HDL:CHD Risk Coronary Heart Disease Risk Table                     Men   Women  1/2 Average Risk   3.4   3.3  Average Risk       5.0   4.4  2 X Average Risk   9.6   7.1  3 X Average Risk  23.4   11.0        Use the calculated Patient Ratio above and the CHD Risk Table to determine the patient's CHD Risk.        ATP III CLASSIFICATION (LDL):  <100     mg/dL   Optimal  161-096  mg/dL   Near or Above                    Optimal  130-159  mg/dL   Borderline  045-409  mg/dL   High  >811     mg/dL   Very High 02/15/7828

## 2012-01-10 NOTE — Patient Instructions (Addendum)
Your physician has requested that you have an exercise stress myoview. For further information please visit [email or web address] Please follow instruction sheet, as given.  Your physician recommends that you schedule a follow-up appointment with Dr. Andee Lineman after stress test.

## 2012-01-13 ENCOUNTER — Encounter (HOSPITAL_COMMUNITY): Payer: Self-pay | Admitting: Cardiology

## 2012-01-14 ENCOUNTER — Other Ambulatory Visit: Payer: Self-pay | Admitting: *Deleted

## 2012-01-14 DIAGNOSIS — R079 Chest pain, unspecified: Secondary | ICD-10-CM

## 2012-01-15 ENCOUNTER — Encounter (HOSPITAL_COMMUNITY): Payer: Self-pay

## 2012-01-15 ENCOUNTER — Encounter (HOSPITAL_COMMUNITY)
Admission: RE | Admit: 2012-01-15 | Discharge: 2012-01-15 | Disposition: A | Discharge status: Home / Self Care | Payer: BC Managed Care – PPO | Source: Ambulatory Visit | Attending: Cardiovascular Disease | Admitting: Cardiovascular Disease

## 2012-01-15 ENCOUNTER — Ambulatory Visit (INDEPENDENT_AMBULATORY_CARE_PROVIDER_SITE_OTHER): Payer: BC Managed Care – PPO

## 2012-01-15 DIAGNOSIS — R079 Chest pain, unspecified: Secondary | ICD-10-CM

## 2012-01-15 DIAGNOSIS — I2510 Atherosclerotic heart disease of native coronary artery without angina pectoris: Secondary | ICD-10-CM

## 2012-01-15 MED ORDER — TECHNETIUM TC 99M TETROFOSMIN IV KIT
30.0000 | PACK | Freq: Once | INTRAVENOUS | Status: AC | PRN
  Administered 2012-01-15: 30.5 via INTRAVENOUS

## 2012-01-15 MED ORDER — TECHNETIUM TC 99M TETROFOSMIN IV KIT
10.0000 | PACK | Freq: Once | INTRAVENOUS | Status: AC | PRN
  Administered 2012-01-15: 10 via INTRAVENOUS

## 2012-01-15 NOTE — Progress Notes (Signed)
Stress Lab Nurses Notes - Eytan Carrigan Weathers 01/15/2012 Reason for doing test: CAD and Chest Pain Type of test: Stress Myoview Nurse performing test: Parke Poisson, RN Nuclear Medicine Tech: Lyndel Pleasure Echo Tech: Not Applicable MD performing test: R. Rothbart & Herma Carson PA Family MD: NPCP Test explained and consent signed: yes IV started: 22g jelco, Saline lock flushed, No redness or edema and Saline lock started in radiology Symptoms:  Fatigue in legs Treatment/Intervention: None Reason test stopped: fatigue After recovery IV was: Discontinued via X-ray tech and No redness or edema Patient to return to Nuc. Med at :  11:45 Patient discharged: Home Patient's Condition upon discharge was: stable Comments: During test BP 182/80 & HR 160.  Recovery BP 132/62 & HR 85.  Symptoms resolved in recovery. Erskine Speed T

## 2012-01-27 ENCOUNTER — Ambulatory Visit: Payer: BC Managed Care – PPO | Admitting: Cardiology

## 2012-02-17 ENCOUNTER — Ambulatory Visit (INDEPENDENT_AMBULATORY_CARE_PROVIDER_SITE_OTHER): Payer: BC Managed Care – PPO | Admitting: Cardiology

## 2012-02-17 ENCOUNTER — Telehealth: Payer: Self-pay | Admitting: *Deleted

## 2012-02-17 ENCOUNTER — Encounter: Payer: Self-pay | Admitting: Cardiology

## 2012-02-17 ENCOUNTER — Encounter: Payer: Self-pay | Admitting: *Deleted

## 2012-02-17 VITALS — BP 112/74 | HR 62 | Ht 66.0 in | Wt 271.8 lb

## 2012-02-17 DIAGNOSIS — E785 Hyperlipidemia, unspecified: Secondary | ICD-10-CM | POA: Insufficient documentation

## 2012-02-17 DIAGNOSIS — IMO0002 Reserved for concepts with insufficient information to code with codable children: Secondary | ICD-10-CM

## 2012-02-17 DIAGNOSIS — R0989 Other specified symptoms and signs involving the circulatory and respiratory systems: Secondary | ICD-10-CM

## 2012-02-17 DIAGNOSIS — R42 Dizziness and giddiness: Secondary | ICD-10-CM

## 2012-02-17 DIAGNOSIS — R21 Rash and other nonspecific skin eruption: Secondary | ICD-10-CM | POA: Insufficient documentation

## 2012-02-17 DIAGNOSIS — I10 Essential (primary) hypertension: Secondary | ICD-10-CM

## 2012-02-17 DIAGNOSIS — R9430 Abnormal result of cardiovascular function study, unspecified: Secondary | ICD-10-CM

## 2012-02-17 DIAGNOSIS — I2510 Atherosclerotic heart disease of native coronary artery without angina pectoris: Secondary | ICD-10-CM

## 2012-02-17 DIAGNOSIS — Z720 Tobacco use: Secondary | ICD-10-CM

## 2012-02-17 DIAGNOSIS — F17200 Nicotine dependence, unspecified, uncomplicated: Secondary | ICD-10-CM

## 2012-02-17 MED ORDER — MECLIZINE HCL 25 MG PO TABS: 25.0000 mg | ORAL_TABLET | ORAL | Status: DC | PRN

## 2012-02-17 NOTE — Assessment & Plan Note (Signed)
Blood pressure is controlled. No change in therapy. 

## 2012-02-17 NOTE — Assessment & Plan Note (Signed)
The patient was added on today for a symptom complex including some dizziness that I believe his vertigo. He had some mild discomfort in the back of his neck which is nonspecific. He also had a headache that is improved. I am recommending to him that he use low-dose meclizine for probable vertigo if he has significant symptoms. I do believe that he is stable to try to return to work tomorrow.  I made sure he understood the physician staffing. He has decided that he would like to stay with our practice. I will follow him in the [place] office.

## 2012-02-17 NOTE — Assessment & Plan Note (Signed)
Coronary disease is stable. I feel that his recent symptoms are not ischemic in origin. No further workup.

## 2012-02-17 NOTE — Telephone Encounter (Signed)
Patient notified

## 2012-02-17 NOTE — Patient Instructions (Addendum)
   Meclizine 25mg  as needed for dizziness Your physician wants you to follow up in: 6 months.  You will receive a reminder letter in the mail one-two months in advance.  If you don't receive a letter, please call our office to schedule the follow up appointment

## 2012-02-17 NOTE — Telephone Encounter (Signed)
OK to stop Effient.

## 2012-02-17 NOTE — Progress Notes (Signed)
HPI  The patient is seen today as an add-on new patient for me. He has seen Dr. Earnestine Leys in the past. More recently he was seen by Dr. Eden Emms in the regional office. At that time he had some chest discomfort and plans were made for nuclear exercise test. The study was done and showed no ischemia. Ejection fraction was 65%. He does have known coronary disease. He received a stent in February, 2012. As part of today's evaluation I have reviewed all of the records and updated the electronic medical record completely.  Several days ago the patient had some discomfort in the back of his neck. There was some slight radiation to the shoulders and possibly to the anterior chest. This symptom was not the same as his ischemic symptoms. When he had ischemia he had lower jaw pain and significant indigestion,  slight radiation to the back, And some tingling in the left arm. He has not had any of these symptoms. Also since a few days ago he's had some mild dizziness. It is worse with head movement. It sounds like vertigo.  Allergies  Allergen Reactions  . Plavix (Clopidogrel Bisulfate) Rash    Current Outpatient Prescriptions  Medication Sig Dispense Refill  . aspirin 81 MG tablet Take 81 mg by mouth daily.        . bisoprolol (ZEBETA) 5 MG tablet TAKE 1/2 TABLET BY MOUTH DAILY  15 tablet  6  . prasugrel (EFFIENT) 10 MG TABS Take by mouth daily.        . pravastatin (PRAVACHOL) 80 MG tablet Take 1 tablet (80 mg total) by mouth daily.  30 tablet  6    History   Social History  . Marital Status: Single    Spouse Name: N/A    Number of Children: N/A  . Years of Education: N/A   Occupational History  . Not on file.   Social History Main Topics  . Smoking status: Current Every Day Smoker -- 0.5 packs/day for 25 years    Types: Cigarettes  . Smokeless tobacco: Never Used   Comment: SMOKING CESSATION COUNSELING Started smoking age 26   . Alcohol Use: 0.0 oz/week     drinks beer on occasion  . Drug  Use: Not on file  . Sexually Active: Not on file   Other Topics Concern  . Not on file   Social History Narrative  . No narrative on file    Family History  Problem Relation Age of Onset  . Coronary artery disease Other     family history     Past Medical History  Diagnosis Date  . Tobacco use disorder   . Obesity, unspecified   . Tietze's disease   . History of migraine headaches     Takes excedrin  . Dyslipidemia   . Essential hypertension, benign   . CAD (coronary artery disease) 07/12/2010        CARDIAC CATHETERIZATION,  Status post bare-metal stent to the circumflex   . Rash     Plavix, February, 2012  . Ejection fraction     EF 60-65%, echo, February, 2012  //   EF 60%, nuclear, July, 2013    Past Surgical History  Procedure Date  . Cardiac catheterization 07/13/2010    Occludded mid LCX, LAD: 30% proximal and 50% distal, RCA 30%. Normal EF by echo  . Coronary angioplasty 07/13/2010    Mid LCX: 3.0 X 18 mm Vision BME    ROS  Patient denies fever, chills, headache, sweats, rash, change in vision, change in hearing, cough, nausea vomiting, urinary symptoms. All other systems are reviewed and are negative.  PHYSICAL EXAM   Patient is overweight. There is no jugulovenous distention. Lungs are clear. Respiratory effort is nonlabored. Cardiac exam reveals S1 and S2. There no clicks or significant murmurs. The abdomen is soft. There is no peripheral edema.There no musculoskeletal deformities. There are no skin rashes.  Filed Vitals:   02/17/12 1038  BP: 112/74  Pulse: 62  Height: 5\' 6"  (1.676 m)  Weight: 271 lb 12.8 oz (123.288 kg)  SpO2: 98%   EKG is done today and reviewed by me. I've compared to his prior tracing. There is no significant change. He has slight decreased R wave anteriorly.  ASSESSMENT & PLAN

## 2012-02-17 NOTE — Telephone Encounter (Signed)
Patient questions if he can stop his Effient.  OV with Dr. Myrtis Ser this morning, forgot to ask.  Had OV with Dr. Eden Emms in [place] on 8/9.  Stress test was ordered & patient states he was told by MD that if okay he could come off this medication.  Stress test done on 8/13 - per Dr. Eden Emms (see test results) - normal myoview with no evidence of ischemia or infarction.   Informed patient of above & told him probably will be okay to stop, but would need to make sure with Dr. Myrtis Ser first.  Patient verbalized understanding.

## 2012-02-17 NOTE — Assessment & Plan Note (Signed)
Patient continues to smoke. I have counseled him to stop. 

## 2012-02-26 ENCOUNTER — Encounter: Payer: Self-pay | Admitting: Cardiology

## 2012-02-26 DIAGNOSIS — R079 Chest pain, unspecified: Secondary | ICD-10-CM

## 2012-02-27 ENCOUNTER — Encounter: Payer: Self-pay | Admitting: Cardiology

## 2012-03-05 ENCOUNTER — Ambulatory Visit: Payer: BC Managed Care – PPO | Admitting: Cardiology

## 2012-05-29 ENCOUNTER — Other Ambulatory Visit: Payer: Self-pay | Admitting: Physician Assistant

## 2012-07-23 ENCOUNTER — Other Ambulatory Visit: Payer: Self-pay | Admitting: Cardiology

## 2012-09-14 ENCOUNTER — Telehealth: Payer: Self-pay | Admitting: Physician Assistant

## 2012-09-14 NOTE — Telephone Encounter (Signed)
Chest pain & cold sweats with headache

## 2012-09-14 NOTE — Telephone Encounter (Signed)
Patient c/o of having a soreness in his chest that started 2 days ago rated 3-4 on a scale of 1-10 (10 being the greatest). Patient also c/o of having chills and a slight tightness in both jaws. No c/o dizziness or sob. Nurse informed patient that he did need to be evaluated and that he can either call his family doctor to be seen, or go to ED for evaluation. Nurse informed patient that if his PCP thinks he need to be seen very soon, we can have this arranged at one of our other clinics. Patient verbalized understanding of plan.

## 2013-02-16 ENCOUNTER — Ambulatory Visit (INDEPENDENT_AMBULATORY_CARE_PROVIDER_SITE_OTHER): Payer: BC Managed Care – PPO | Admitting: Cardiology

## 2013-02-16 ENCOUNTER — Encounter (HOSPITAL_COMMUNITY): Payer: Self-pay | Admitting: Pharmacy Technician

## 2013-02-16 ENCOUNTER — Encounter: Payer: Self-pay | Admitting: Cardiology

## 2013-02-16 ENCOUNTER — Inpatient Hospital Stay (HOSPITAL_COMMUNITY)
Admission: AD | Admit: 2013-02-16 | Payer: Self-pay | Source: Other Acute Inpatient Hospital | Admitting: Cardiovascular Disease

## 2013-02-16 VITALS — BP 132/84 | HR 66 | Ht 66.0 in | Wt 282.0 lb

## 2013-02-16 DIAGNOSIS — I2510 Atherosclerotic heart disease of native coronary artery without angina pectoris: Secondary | ICD-10-CM

## 2013-02-16 DIAGNOSIS — R079 Chest pain, unspecified: Secondary | ICD-10-CM

## 2013-02-16 NOTE — Patient Instructions (Addendum)
Patient is going to [facility] to rule out chest pain.

## 2013-02-16 NOTE — Progress Notes (Signed)
Clinical Summary Mr. Corkins is a 42 y.o.male  1. CAD/Chest pain:  - prior stent Feb 2012 to LCX - recent negative nuclear exercise stress - reports some chest pain in left chest to midchest. Feels a tightness that started Thursday. Tightness 5/10, can occur at rest or w/ exertion. Lasts anywhere from a few minutes to 30 minutes. Can go as long as an hour. No SOB, no nausea, no diaphoresis. Nothing makes better or worst. Does not have NG at home. Reports increase in severity and frequency over last few days. Can radiate to jaw, which similar to prior angina.   Past Medical History  Diagnosis Date  . Tobacco use disorder   . Obesity, unspecified   . Tietze's disease   . History of migraine headaches     Takes excedrin  . Dyslipidemia   . Essential hypertension, benign   . CAD (coronary artery disease) 07/12/2010        CARDIAC CATHETERIZATION,  Status post bare-metal stent to the circumflex   . Rash     Plavix, February, 2012  . Ejection fraction     EF 60-65%, echo, February, 2012  //   EF 60%, nuclear, July, 2013  . Dizziness     September, 2013, probable vertigo   Cardiac catheterization  07/13/2010  Occludded mid LCX, LAD: 30% proximal and 50% distal, RCA 30%. Normal EF by echo  .  Coronary angioplasty  07/13/2010  Mid LCX: 3.0 X 18 mm Vision BME    Allergies  Allergen Reactions  . Plavix [Clopidogrel Bisulfate] Rash     Current Outpatient Prescriptions  Medication Sig Dispense Refill  . aspirin 81 MG tablet Take 81 mg by mouth daily.        . bisoprolol (ZEBETA) 5 MG tablet TAKE 1/2 TABLET BY MOUTH DAILY  30 tablet  6  . carisoprodol (SOMA) 350 MG tablet Take 350 mg by mouth 3 (three) times daily as needed for muscle spasms.      Marland Kitchen HYDROcodone-acetaminophen (NORCO/VICODIN) 5-325 MG per tablet Take 1 tablet by mouth every 4 (four) hours as needed for pain.      . pravastatin (PRAVACHOL) 80 MG tablet Take 80 mg by mouth daily.       No current  facility-administered medications for this visit.     Past Surgical History  Procedure Laterality Date  . Cardiac catheterization  07/13/2010    Occludded mid LCX, LAD: 30% proximal and 50% distal, RCA 30%. Normal EF by echo  . Coronary angioplasty  07/13/2010    Mid LCX: 3.0 X 18 mm Vision BME     Allergies  Allergen Reactions  . Plavix [Clopidogrel Bisulfate] Rash      Family History  Problem Relation Age of Onset  . Coronary artery disease Other     family history      Social History Mr. Basto reports that he has been smoking Cigarettes.  He has a 12.5 pack-year smoking history. He has never used smokeless tobacco. Mr. Plaia reports that  drinks alcohol.   Review of Systems 12 point ROS negative other than reported in HPI  Physical Examination Filed Vitals:   02/16/13 1153  BP: 132/84  Pulse: 66   Filed Weights   02/16/13 1153  Weight: 282 lb (127.914 kg)    Gen: resting comfortably, NAD HEENT: no scleral icterus, pupils equal round and reactive, no palptable cervical adenopathy CV Pulm: CTAB Abd: soft, NT, ND NABS, no hepatosplenomegaly Ext: warm, no edema.  Skin: warm, no rash Neuro: A&Ox3, no focal deficits    Diagnostic Studies 01/2012 Exc Nuclear: 10 minutes, 10.4 METs, 89% THR, no perfusion defects.  02/16/13 EKG: SR, rate 75, normal axis, LAE, no LVH, no ischemic changes  Assessment and Plan  1. Chest pain: mixed story for cardiac pain, concerning that it is accelerating in frequency and severity, and involves some components similar to his prior angina. EKG shows no acute ischemic changes currently, he is hemodynamically stable - will send patient to [facility] ER for serial EKG and cardiac enzymes   Antoine Poche, M.D., F.A.C.C.

## 2013-02-17 ENCOUNTER — Encounter (HOSPITAL_COMMUNITY): Admission: RE | Payer: Self-pay | Source: Ambulatory Visit

## 2013-02-17 ENCOUNTER — Ambulatory Visit (HOSPITAL_COMMUNITY)
Admission: RE | Admit: 2013-02-17 | Payer: BC Managed Care – PPO | Source: Ambulatory Visit | Admitting: Cardiovascular Disease

## 2013-02-17 SURGERY — LEFT HEART CATHETERIZATION WITH CORONARY ANGIOGRAM

## 2013-03-31 ENCOUNTER — Other Ambulatory Visit: Payer: Self-pay | Admitting: Cardiology

## 2013-03-31 MED ORDER — PRAVASTATIN SODIUM 80 MG PO TABS: 80.0000 mg | ORAL_TABLET | Freq: Every day | ORAL | Status: DC

## 2013-06-28 ENCOUNTER — Other Ambulatory Visit: Payer: Self-pay

## 2013-06-28 MED ORDER — BISOPROLOL FUMARATE 5 MG PO TABS: 2.5000 mg | ORAL_TABLET | Freq: Every day | ORAL | Status: DC

## 2013-07-13 ENCOUNTER — Other Ambulatory Visit: Payer: Self-pay | Admitting: Cardiovascular Disease

## 2013-07-23 ENCOUNTER — Other Ambulatory Visit: Payer: Self-pay | Admitting: Cardiovascular Disease

## 2013-08-06 ENCOUNTER — Ambulatory Visit: Payer: BC Managed Care – PPO | Admitting: Cardiology

## 2013-08-26 ENCOUNTER — Encounter: Payer: Self-pay | Admitting: *Deleted

## 2013-08-26 ENCOUNTER — Encounter: Payer: Self-pay | Admitting: Cardiology

## 2013-08-26 ENCOUNTER — Telehealth: Payer: Self-pay | Admitting: Cardiology

## 2013-08-26 ENCOUNTER — Encounter (HOSPITAL_COMMUNITY): Payer: Self-pay | Admitting: Pharmacy Technician

## 2013-08-26 ENCOUNTER — Ambulatory Visit (INDEPENDENT_AMBULATORY_CARE_PROVIDER_SITE_OTHER): Payer: BC Managed Care – PPO | Admitting: Cardiology

## 2013-08-26 VITALS — BP 124/79 | HR 52 | Ht 66.0 in | Wt 215.0 lb

## 2013-08-26 DIAGNOSIS — I2510 Atherosclerotic heart disease of native coronary artery without angina pectoris: Secondary | ICD-10-CM

## 2013-08-26 DIAGNOSIS — E785 Hyperlipidemia, unspecified: Secondary | ICD-10-CM

## 2013-08-26 DIAGNOSIS — R079 Chest pain, unspecified: Secondary | ICD-10-CM

## 2013-08-26 MED ORDER — AMLODIPINE BESYLATE 5 MG PO TABS: 5.0000 mg | ORAL_TABLET | Freq: Every day | ORAL | Status: DC

## 2013-08-26 MED ORDER — NITROGLYCERIN 0.4 MG SL SUBL: 0.4000 mg | SUBLINGUAL_TABLET | SUBLINGUAL | Status: AC | PRN

## 2013-08-26 NOTE — Telephone Encounter (Signed)
Pt has [employer].  No precert required for heart cath

## 2013-08-26 NOTE — Telephone Encounter (Signed)
No precert required 

## 2013-08-26 NOTE — Progress Notes (Signed)
Clinical Summary Alexander Shannon is a 42 y.o.male seen today for follow up of the following medical problems.  1. CAD/Chest pain:  - prior stent Feb 2012 to LCX  - negative nuclear exercise stress in 2013 - reports some chest pain in left chest to midchest. Feels a tightness 5/10, can occur at rest or w/ exertion. Lasts anywhere from a few minutes to 30 minutes. Can go as long as an hour. No SOB, no nausea, no diaphoresis. Nothing makes better or worst. Does not have NG at home.Can radiate to jaw, which is similar to prior angina.  - described symptoms at last visit, was referred to [facility] ER with negative workup, recs were for transfer to [facility] however he left AMA  - since last visit has continued to have some occasional chest discomfort across chest and jaw, lasting just a few minutes. Stable in frequency, stable in severity.    Past Medical History  Diagnosis Date  . Tobacco use disorder   . Obesity, unspecified   . Tietze's disease   . History of migraine headaches     Takes excedrin  . Dyslipidemia   . Essential hypertension, benign   . CAD (coronary artery disease) 07/12/2010        CARDIAC CATHETERIZATION,  Status post bare-metal stent to the circumflex   . Rash     Plavix, February, 2012  . Ejection fraction     EF 60-65%, echo, February, 2012  //   EF 60%, nuclear, July, 2013  . Dizziness     September, 2013, probable vertigo     Allergies  Allergen Reactions  . Plavix [Clopidogrel Bisulfate] Rash     Current Outpatient Prescriptions  Medication Sig Dispense Refill  . aspirin 81 MG tablet Take 81 mg by mouth daily.        . bisoprolol (ZEBETA) 5 MG tablet TAKE 1/2 TABLET BY MOUTH DAILY  30 tablet  6  . carisoprodol (SOMA) 350 MG tablet Take 350 mg by mouth 3 (three) times daily as needed for muscle spasms.      Marland Kitchen HYDROcodone-acetaminophen (NORCO/VICODIN) 5-325 MG per tablet Take 1 tablet by mouth every 4 (four) hours as needed for pain.      . pravastatin  (PRAVACHOL) 80 MG tablet Take 1 tablet (80 mg total) by mouth daily.  30 tablet  6   No current facility-administered medications for this visit.     Past Surgical History  Procedure Laterality Date  . Cardiac catheterization  07/13/2010    Occludded mid LCX, LAD: 30% proximal and 50% distal, RCA 30%. Normal EF by echo  . Coronary angioplasty  07/13/2010    Mid LCX: 3.0 X 18 mm Vision BME     Allergies  Allergen Reactions  . Plavix [Clopidogrel Bisulfate] Rash      Family History  Problem Relation Age of Onset  . Coronary artery disease Other     family history      Social History Alexander Shannon reports that he has been smoking Cigarettes.  He has a 12.5 pack-year smoking history. He has never used smokeless tobacco. Alexander Shannon reports that he drinks alcohol.   Review of Systems CONSTITUTIONAL: No weight Shannon, fever, chills, weakness or fatigue.  HEENT: Eyes: No visual Shannon, blurred vision, double vision or yellow sclerae.No hearing Shannon, sneezing, congestion, runny nose or sore throat.  SKIN: No rash or itching.  CARDIOVASCULAR: per HPI RESPIRATORY: No shortness of breath, cough or sputum.  GASTROINTESTINAL: No  anorexia, nausea, vomiting or diarrhea. No abdominal pain or blood.  GENITOURINARY: No burning on urination, no polyuria NEUROLOGICAL: No headache, dizziness, syncope, paralysis, ataxia, numbness or tingling in the extremities. No change in bowel or bladder control.  MUSCULOSKELETAL: No muscle, back pain, joint pain or stiffness.  LYMPHATICS: No enlarged nodes. No history of splenectomy.  PSYCHIATRIC: No history of depression or anxiety.  ENDOCRINOLOGIC: No reports of sweating, cold or heat intolerance. No polyuria or polydipsia.  Marland Kitchen.   Physical Examination p 52 bp 124/79 Wt 215 lbs BMI 35 Gen: resting comfortably, no acute distress HEENT: no scleral icterus, pupils equal round and reactive, no palptable cervical adenopathy,  CV: rate 55, regular, no m/r/g,no  JVD Resp: Clear to auscultation bilaterally GI: abdomen is soft, non-tender, non-distended, normal bowel sounds, no hepatosplenomegaly MSK: extremities are warm, no edema.  Skin: warm, no rash Neuro:  no focal deficits Psych: appropriate affect   Diagnostic Studies 01/2012 Exc Nuclear: 10.4 METs, 89% THR, no perfusion defects.   02/16/13 EKG: SR, rate 75, normal axis, LAE, no LVH, no ischemic changes     Assessment and Plan  1. Chest pain: mixed story for cardiac pain, does involve some components similar to his prior angina. Concerning that episodes can occur at rest as well.[facility] - given CAD history and suggestive symptoms, [facility]. Will start norvasc as additional antianginal, along with prn nitroglycerin  2. Hyperlipidemia - repeat fasting lipid panel - continue current statin  Follow up after cath    Antoine PocheJonathan F. Dayshon Roback, M.D., F.A.C.C.

## 2013-08-26 NOTE — Telephone Encounter (Signed)
Left heart cath - 4/1 - 11:30 - Alexander Shannon  CHECKING PERCERT

## 2013-08-26 NOTE — Patient Instructions (Signed)
   Begin Norvasc 5mg  daily   Begin Nitroglycerin as needed for severe chest pain only   Above medications have been sent to pharmacy.   Your physician has requested that you have a cardiac catheterization. Cardiac catheterization is used to diagnose and/or treat various heart conditions. Doctors may recommend this procedure for a number of different reasons. The most common reason is to evaluate chest pain. Chest pain can be a symptom of coronary artery disease (CAD), and cardiac catheterization can show whether plaque is narrowing or blocking your heart's arteries. This procedure is also used to evaluate the valves, as well as measure the blood flow and oxygen levels in different parts of your heart. For further information please visit [email or web address]. Please follow instruction sheet, as given. Follow up will be given after procedure.

## 2013-09-01 ENCOUNTER — Ambulatory Visit (HOSPITAL_COMMUNITY)
Admission: RE | Admit: 2013-09-01 | Discharge: 2013-09-01 | Disposition: A | Discharge status: Home / Self Care | Payer: BC Managed Care – PPO | Source: Ambulatory Visit | Attending: Cardiology | Admitting: Cardiology

## 2013-09-01 ENCOUNTER — Encounter (HOSPITAL_COMMUNITY)
Admission: RE | Disposition: A | Discharge status: Home / Self Care | Payer: Self-pay | Source: Ambulatory Visit | Attending: Cardiology

## 2013-09-01 DIAGNOSIS — F17200 Nicotine dependence, unspecified, uncomplicated: Secondary | ICD-10-CM | POA: Insufficient documentation

## 2013-09-01 DIAGNOSIS — M940 Chondrocostal junction syndrome [Tietze]: Secondary | ICD-10-CM | POA: Insufficient documentation

## 2013-09-01 DIAGNOSIS — I10 Essential (primary) hypertension: Secondary | ICD-10-CM | POA: Insufficient documentation

## 2013-09-01 DIAGNOSIS — E785 Hyperlipidemia, unspecified: Secondary | ICD-10-CM | POA: Insufficient documentation

## 2013-09-01 DIAGNOSIS — I2510 Atherosclerotic heart disease of native coronary artery without angina pectoris: Secondary | ICD-10-CM | POA: Insufficient documentation

## 2013-09-01 DIAGNOSIS — Z7982 Long term (current) use of aspirin: Secondary | ICD-10-CM | POA: Insufficient documentation

## 2013-09-01 DIAGNOSIS — E669 Obesity, unspecified: Secondary | ICD-10-CM | POA: Insufficient documentation

## 2013-09-01 HISTORY — PX: LEFT HEART CATHETERIZATION WITH CORONARY ANGIOGRAM: SHX5451

## 2013-09-01 LAB — CBC
HCT: 45 % (ref 39.0–52.0)
Hemoglobin: 15.7 g/dL (ref 13.0–17.0)
MCH: 30 pg (ref 26.0–34.0)
MCHC: 34.9 g/dL (ref 30.0–36.0)
MCV: 85.9 fL (ref 78.0–100.0)
Platelets: 238 10*3/uL (ref 150–400)
RBC: 5.24 MIL/uL (ref 4.22–5.81)
RDW: 12.5 % (ref 11.5–15.5)
WBC: 11.6 10*3/uL — AB (ref 4.0–10.5)

## 2013-09-01 LAB — BASIC METABOLIC PANEL
BUN: 8 mg/dL (ref 6–23)
CHLORIDE: 102 meq/L (ref 96–112)
CO2: 22 mEq/L (ref 19–32)
CREATININE: 0.98 mg/dL (ref 0.50–1.35)
Calcium: 9.2 mg/dL (ref 8.4–10.5)
GFR calc Af Amer: >90 mL/min (ref >90)
Glucose, Bld: 111 mg/dL — ABNORMAL HIGH (ref 70–99)
Potassium: 4.2 mEq/L (ref 3.7–5.3)
Sodium: 138 mEq/L (ref 137–147)

## 2013-09-01 LAB — PROTIME-INR
INR: 1.07 (ref 0.00–1.49)
Prothrombin Time: 13.7 seconds (ref 11.6–15.2)

## 2013-09-01 SURGERY — LEFT HEART CATHETERIZATION WITH CORONARY ANGIOGRAM
Anesthesia: LOCAL

## 2013-09-01 MED ORDER — VERAPAMIL HCL 2.5 MG/ML IV SOLN
INTRAVENOUS | Status: AC
  Filled 2013-09-01: qty 2

## 2013-09-01 MED ORDER — MIDAZOLAM HCL 2 MG/2ML IJ SOLN
INTRAMUSCULAR | Status: AC
  Filled 2013-09-01: qty 2

## 2013-09-01 MED ORDER — SODIUM CHLORIDE 0.9 % IV SOLN
INTRAVENOUS | Status: DC
  Administered 2013-09-01: 10:00:00 via INTRAVENOUS

## 2013-09-01 MED ORDER — LIDOCAINE HCL (PF) 1 % IJ SOLN
INTRAMUSCULAR | Status: AC
  Filled 2013-09-01: qty 30

## 2013-09-01 MED ORDER — SODIUM CHLORIDE 0.9 % IV SOLN: 250.0000 mL | INTRAVENOUS | Status: DC | PRN

## 2013-09-01 MED ORDER — HEPARIN (PORCINE) IN NACL 2-0.9 UNIT/ML-% IJ SOLN
INTRAMUSCULAR | Status: AC
  Filled 2013-09-01: qty 1000

## 2013-09-01 MED ORDER — SODIUM CHLORIDE 0.9 % IV SOLN: 1.0000 mL/kg/h | INTRAVENOUS | Status: DC

## 2013-09-01 MED ORDER — SODIUM CHLORIDE 0.9 % IJ SOLN: 3.0000 mL | INTRAMUSCULAR | Status: DC | PRN

## 2013-09-01 MED ORDER — ASPIRIN 81 MG PO CHEW
81.0000 mg | CHEWABLE_TABLET | ORAL | Status: AC
  Administered 2013-09-01: 81 mg via ORAL
  Filled 2013-09-01: qty 1

## 2013-09-01 MED ORDER — FENTANYL CITRATE 0.05 MG/ML IJ SOLN
INTRAMUSCULAR | Status: AC
  Filled 2013-09-01: qty 2

## 2013-09-01 MED ORDER — NITROGLYCERIN 0.2 MG/ML ON CALL CATH LAB
INTRAVENOUS | Status: AC
  Filled 2013-09-01: qty 1

## 2013-09-01 MED ORDER — HEPARIN SODIUM (PORCINE) 1000 UNIT/ML IJ SOLN
INTRAMUSCULAR | Status: AC
  Filled 2013-09-01: qty 1

## 2013-09-01 MED ORDER — SODIUM CHLORIDE 0.9 % IJ SOLN: 3.0000 mL | Freq: Two times a day (BID) | INTRAMUSCULAR | Status: DC

## 2013-09-01 NOTE — CV Procedure (Signed)
    Cardiac Catheterization Procedure Note  Name: Alexander Shannon MRN: [id number] DOB: 11/09/1970  Procedure: Left Heart Cath, Selective Coronary Angiography, LV angiography  Indication: 43 yo male with history of CAD s/p stenting of the LCx in 2012 presents with symptoms of chest pain.   Procedural Details: The right wrist was prepped, draped, and anesthetized with 1% lidocaine. Using the modified Seldinger technique, a 5 French sheath was introduced into the right radial artery. 3 mg of verapamil was administered through the sheath, weight-based unfractionated heparin was administered intravenously. Standard Judkins catheters were used for selective coronary angiography and left ventriculography. Catheter exchanges were performed over an exchange length guidewire. There were no immediate procedural complications. A TR band was used for radial hemostasis at the completion of the procedure.  The patient was transferred to the post catheterization recovery area for further monitoring.  Procedural Findings: Hemodynamics: AO 154/91 mean 117 mm Hg LV 160/21 mm Hg  Coronary angiography: Coronary dominance: right  Left mainstem: Normal  Left anterior descending (LAD): Mild disease in the mid vessel up to 20%.   Left circumflex (LCx): The stent in the proximal vessel is patent with 20-30% disease in the distal stent and in the native vessel just distal to the stent.  Right coronary artery (RCA): mild irregularities in the mid vessel to 10-20%  Left ventriculography: Left ventricular systolic function is normal, LVEF is estimated at 55-65%, there is no significant mitral regurgitation   Final Conclusions:   1. Mild nonobstructive CAD, patent stent in the LCx. 2. Normal LV function.  Recommendations: Continue medical management.  Mckennah Kretchmer Northside Mental HealthJordanMD,FACC 09/01/2013, 1:30 PM

## 2013-09-01 NOTE — Discharge Instructions (Signed)
Radial Site Care Refer to this sheet in the next few weeks. These instructions provide you with information on caring for yourself after your procedure. Your caregiver may also give you more specific instructions. Your treatment has been planned according to current medical practices, but problems sometimes occur. Call your caregiver if you have any problems or questions after your procedure. HOME CARE INSTRUCTIONS  You may shower the day after the procedure.Remove the bandage (dressing) and gently wash the site with plain soap and water.Gently pat the site dry.  Do not apply powder or lotion to the site.  Do not submerge the affected site in water for 3 to 5 days.  Inspect the site at least twice daily.  Do not flex or bend the affected arm for 24 hours.  No lifting over 5 pounds (2.3 kg) for 5 days after your procedure.  Do not drive home if you are discharged the same day of the procedure. Have someone else drive you.  You may drive 24 hours after the procedure unless otherwise instructed by your caregiver.  Do not operate machinery or power tools for 24 hours.  A responsible adult should be with you for the first 24 hours after you arrive home. What to expect:  Any bruising will usually fade within 1 to 2 weeks.  Blood that collects in the tissue (hematoma) may be painful to the touch. It should usually decrease in size and tenderness within 1 to 2 weeks. SEEK IMMEDIATE MEDICAL CARE IF:  You have unusual pain at the radial site.  You have redness, warmth, swelling, or pain at the radial site.  You have drainage (other than a small amount of blood on the dressing).  You have chills.  You have a fever or persistent symptoms for more than 72 hours.  You have a fever and your symptoms suddenly get worse.  Your arm becomes pale, cool, tingly, or numb.  You have heavy bleeding from the site. Hold pressure on the site. Document Released: 06/22/2010 Document Revised:  08/12/2011 Document Reviewed: 06/22/2010 [facility] Patient Information 2014 [place], [place].                                  Return To Work __[employer]___ was treated at our facility. INJURY OR ILLNESS WAS: _____ Work-related __X___ Not work-related _____ Undetermined if work-related RETURN TO WORK  Employee may return to work on: _04/07/2015____  Employee may return to modified work on: _04/03/2015_ WORK ACTIVITY RESTRICTIONS Work activities not tolerated include: _____ Bending _____ Prolonged sitting __X___ Lifting-----NO LIFTING/PUSHING//PULLING OF MORE THAN 5 POUNDS FOR 5 DAYS _____ Squatting _____ Prolonged standing _____ Climbing _____ Reaching __X___ Pushing and pulling _____ Walking _____ Other ____________________ Show this Return to Work statement to your supervisor at work as soon as possible. Your employer should be aware of your condition and can help with the necessary work activity restrictions. If you wish to return to work sooner than the date above, or if you have further problems which make it difficult for you to return at that time, please call us or your caregiver. _Dr. Theron AristaPeter Jordan________ Physician Name (Printed) ________________________________ Physician Signature  _04/01/2015____ Date Document Released: 05/20/2005 Document Revised: 08/12/2011 Document Reviewed: 11/04/2006 ExitCare Patient Information 2014 [place], [place].

## 2013-09-01 NOTE — Interval H&P Note (Signed)
History and Physical Interval Note:  09/01/2013 1:08 PM  Alexander Shannon  has presented today for surgery, with the diagnosis of cp  The various methods of treatment have been discussed with the patient and family. After consideration of risks, benefits and other options for treatment, the patient has consented to  Procedure(s): LEFT HEART CATHETERIZATION WITH CORONARY ANGIOGRAM (N/A) as a surgical intervention .  The patient's history has been reviewed, patient examined, no change in status, stable for surgery.  I have reviewed the patient's chart and labs.  Questions were answered to the patient's satisfaction.   Cath Lab Visit (complete for each Cath Lab visit)  Clinical Evaluation Leading to the Procedure:   ACS: no  Non-ACS:    Anginal Classification: CCS III  Anti-ischemic medical therapy: Minimal Therapy (1 class of medications)  Non-Invasive Test Results: No non-invasive testing performed  Prior CABG: No previous CABG        Alexander Boivin Swazil[place]Jordan MD,FACC 09/01/2013 1:08 PM

## 2013-09-01 NOTE — H&P (View-Only) (Signed)
Clinical Summary Mr. Bruso is a 43 y.o.male seen today for follow up of the following medical problems.  1. CAD/Chest pain:  - prior stent Feb 2012 to LCX  - negative nuclear exercise stress in 2013 - reports some chest pain in left chest to midchest. Feels a tightness 5/10, can occur at rest or w/ exertion. Lasts anywhere from a few minutes to 30 minutes. Can go as long as an hour. No SOB, no nausea, no diaphoresis. Nothing makes better or worst. Does not have NG at home.Can radiate to jaw, which is similar to prior angina.  - described symptoms at last visit, was referred to [facility] ER with negative workup, recs were for transfer to [facility] however he left AMA  - since last visit has continued to have some occasional chest discomfort across chest and jaw, lasting just a few minutes. Stable in frequency, stable in severity.    Past Medical History  Diagnosis Date  . Tobacco use disorder   . Obesity, unspecified   . Tietze's disease   . History of migraine headaches     Takes excedrin  . Dyslipidemia   . Essential hypertension, benign   . CAD (coronary artery disease) 07/12/2010        CARDIAC CATHETERIZATION,  Status post bare-metal stent to the circumflex   . Rash     Plavix, February, 2012  . Ejection fraction     EF 60-65%, echo, February, 2012  //   EF 60%, nuclear, July, 2013  . Dizziness     September, 2013, probable vertigo     Allergies  Allergen Reactions  . Plavix [Clopidogrel Bisulfate] Rash     Current Outpatient Prescriptions  Medication Sig Dispense Refill  . aspirin 81 MG tablet Take 81 mg by mouth daily.        . bisoprolol (ZEBETA) 5 MG tablet TAKE 1/2 TABLET BY MOUTH DAILY  30 tablet  6  . carisoprodol (SOMA) 350 MG tablet Take 350 mg by mouth 3 (three) times daily as needed for muscle spasms.      Marland Kitchen HYDROcodone-acetaminophen (NORCO/VICODIN) 5-325 MG per tablet Take 1 tablet by mouth every 4 (four) hours as needed for pain.      . pravastatin  (PRAVACHOL) 80 MG tablet Take 1 tablet (80 mg total) by mouth daily.  30 tablet  6   No current facility-administered medications for this visit.     Past Surgical History  Procedure Laterality Date  . Cardiac catheterization  07/13/2010    Occludded mid LCX, LAD: 30% proximal and 50% distal, RCA 30%. Normal EF by echo  . Coronary angioplasty  07/13/2010    Mid LCX: 3.0 X 18 mm Vision BME     Allergies  Allergen Reactions  . Plavix [Clopidogrel Bisulfate] Rash      Family History  Problem Relation Age of Onset  . Coronary artery disease Other     family history      Social History Mr. Craddock reports that he has been smoking Cigarettes.  He has a 12.5 pack-year smoking history. He has never used smokeless tobacco. Mr. Boyland reports that he drinks alcohol.   Review of Systems CONSTITUTIONAL: No weight loss, fever, chills, weakness or fatigue.  HEENT: Eyes: No visual loss, blurred vision, double vision or yellow sclerae.No hearing loss, sneezing, congestion, runny nose or sore throat.  SKIN: No rash or itching.  CARDIOVASCULAR: per HPI RESPIRATORY: No shortness of breath, cough or sputum.  GASTROINTESTINAL: No  anorexia, nausea, vomiting or diarrhea. No abdominal pain or blood.  GENITOURINARY: No burning on urination, no polyuria NEUROLOGICAL: No headache, dizziness, syncope, paralysis, ataxia, numbness or tingling in the extremities. No change in bowel or bladder control.  MUSCULOSKELETAL: No muscle, back pain, joint pain or stiffness.  LYMPHATICS: No enlarged nodes. No history of splenectomy.  PSYCHIATRIC: No history of depression or anxiety.  ENDOCRINOLOGIC: No reports of sweating, cold or heat intolerance. No polyuria or polydipsia.  Marland Kitchen.   Physical Examination p 52 bp 124/79 Wt 215 lbs BMI 35 Gen: resting comfortably, no acute distress HEENT: no scleral icterus, pupils equal round and reactive, no palptable cervical adenopathy,  CV: rate 55, regular, no m/r/g,no  JVD Resp: Clear to auscultation bilaterally GI: abdomen is soft, non-tender, non-distended, normal bowel sounds, no hepatosplenomegaly MSK: extremities are warm, no edema.  Skin: warm, no rash Neuro:  no focal deficits Psych: appropriate affect   Diagnostic Studies 01/2012 Exc Nuclear: 10.4 METs, 89% THR, no perfusion defects.   02/16/13 EKG: SR, rate 75, normal axis, LAE, no LVH, no ischemic changes     Assessment and Plan  1. Chest pain: mixed story for cardiac pain, does involve some components similar to his prior angina. Concerning that episodes can occur at rest as well.[facility] - given CAD history and suggestive symptoms, [facility]. Will start norvasc as additional antianginal, along with prn nitroglycerin  2. Hyperlipidemia - repeat fasting lipid panel - continue current statin  Follow up after cath    Antoine PocheJonathan F. Chaunice Obie, M.D., F.A.C.C.

## 2013-09-06 ENCOUNTER — Telehealth: Payer: Self-pay | Admitting: Cardiology

## 2013-09-06 DIAGNOSIS — I2510 Atherosclerotic heart disease of native coronary artery without angina pectoris: Secondary | ICD-10-CM

## 2013-09-06 DIAGNOSIS — R21 Rash and other nonspecific skin eruption: Secondary | ICD-10-CM

## 2013-09-06 NOTE — Telephone Encounter (Signed)
[facility] and stated that this patient called them stating that his arm is swollen, red and hurts.    He had a cath on 09/01/13.

## 2013-09-06 NOTE — Telephone Encounter (Signed)
Pt states that arm doesn't hurt that it itches, swollen, and warm to the touch. He also states that he can feel a knot in his arm. I informed pt to go to ER or urgent care. Pt verbalized understanding.

## 2013-09-07 NOTE — Telephone Encounter (Signed)
I agree with your recommendation, with recent cath and those symptoms patient needs to be evaluated in ER with likely imaging to look for any abnormalities    Dina RichJonathan Shaynna Husby MD

## 2013-09-08 ENCOUNTER — Telehealth: Payer: Self-pay | Admitting: Cardiology

## 2013-09-08 NOTE — Addendum Note (Signed)
Addended by: Burnice LoganATES, Bret Vanessen M on: 09/08/2013 01:33 PM   Modules accepted: Orders

## 2013-09-08 NOTE — Telephone Encounter (Signed)
Informed pt of below. Pt agrees with US and states that [facility] can go [facility] to have test done.   Antoine PocheJonathan F Branch, MD Burnice LoganBarbara M Cates, CMA            Yes, please have Susy FrizzleMatt add him on for tomorrow. Arterial US right upper extremity, please mention in comments he had recent radial cath now with pain   Dina RichJonathan Branch MD

## 2013-09-08 NOTE — Telephone Encounter (Signed)
I called Alexander Shannon today asking him to come into office for US ordered by Dr. Wyline MoodBranch. During conversation the telephone dropped the Call.  I have not been able to reach Alexander Shannon.

## 2013-09-09 ENCOUNTER — Other Ambulatory Visit: Payer: Self-pay | Admitting: Cardiology

## 2013-09-09 ENCOUNTER — Other Ambulatory Visit: Payer: BC Managed Care – PPO

## 2013-09-09 ENCOUNTER — Telehealth: Payer: Self-pay | Admitting: Cardiology

## 2013-09-09 DIAGNOSIS — I2510 Atherosclerotic heart disease of native coronary artery without angina pectoris: Secondary | ICD-10-CM

## 2013-09-09 DIAGNOSIS — R21 Rash and other nonspecific skin eruption: Secondary | ICD-10-CM

## 2013-09-09 NOTE — Telephone Encounter (Signed)
Radiology is still unable to schedule appointment. Called and informed pt wife that test is unable to be scheduled due to an issue with computers. She verbalized understanding.

## 2013-09-09 NOTE — Telephone Encounter (Signed)
Wife called and said son was sick and that pt would be unable to make appointment. Rash is better but pt did not inform wife if lump was still present. Will try to have [facility] scheduled for [facility].

## 2013-09-10 NOTE — Telephone Encounter (Signed)
Unable to schedule appointment for doppler due to EPIC issue. Per Darel HongJudy, this computer issue is still being worked on and Eber JonesCarolyn is handling this issue and will contact patient directly once she's able to schedule.

## 2013-09-13 NOTE — Telephone Encounter (Signed)
Lm to have patient call back in reference to test to be performed Wed April 15th @ 12:15 needs to arrive at [phone]

## 2013-09-14 NOTE — Telephone Encounter (Signed)
Pt called wanting to cancel appt again. Pt stated that he has been told that after his insurance it would cost him $1,000.00. I asked pt if knot was still present on his arm. He stated yes it was and that it was swollen even down to his hand. I informed pt that the test is important to have done so we can see what the issue is with his hand. I informed pt that if this is a blood clot that it could travel to his heart, brain, or his lungs and this could cause him his life. Then we was disconnected.

## 2013-09-14 NOTE — Telephone Encounter (Signed)
I've attempted to call pt x2 and line is still busy. I've attempted to call pt wife x2 and # is not in service.

## 2013-09-14 NOTE — Telephone Encounter (Signed)
atempted to call pt x2 and line was busy

## 2013-09-15 ENCOUNTER — Ambulatory Visit (HOSPITAL_COMMUNITY)
Admission: RE | Admit: 2013-09-15 | Discharge: 2013-09-15 | Disposition: A | Discharge status: Home / Self Care | Payer: BC Managed Care – PPO | Source: Ambulatory Visit | Attending: Cardiology | Admitting: Cardiology

## 2013-09-15 ENCOUNTER — Other Ambulatory Visit: Payer: Self-pay | Admitting: Cardiology

## 2013-09-15 DIAGNOSIS — I2510 Atherosclerotic heart disease of native coronary artery without angina pectoris: Secondary | ICD-10-CM

## 2013-09-15 DIAGNOSIS — R21 Rash and other nonspecific skin eruption: Secondary | ICD-10-CM

## 2013-09-15 DIAGNOSIS — M7989 Other specified soft tissue disorders: Secondary | ICD-10-CM | POA: Insufficient documentation

## 2013-09-15 DIAGNOSIS — Z9889 Other specified postprocedural states: Secondary | ICD-10-CM | POA: Insufficient documentation

## 2013-09-15 NOTE — Telephone Encounter (Signed)
Pt had ultrasound on his arm today. And Pt wants to know if he should be working. 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 Should pt be working?

## 2013-09-15 NOTE — Telephone Encounter (Signed)
Ultrasound shows no significant abnormalities, if the patient is comfortable working with the swelling then it is ok. If it is too uncomfortable we can provide a note for the next 3 days.   Alexander RichJonathan Esdras Delair MD

## 2013-09-16 ENCOUNTER — Telehealth: Payer: Self-pay | Admitting: Cardiology

## 2013-09-16 ENCOUNTER — Encounter: Payer: Self-pay | Admitting: Cardiology

## 2013-09-16 NOTE — Telephone Encounter (Signed)
Pt informed and request note for 3 days to be out of work. Pt will call back to provide a fax number so I can fax note to him.

## 2013-09-16 NOTE — Telephone Encounter (Signed)
Pt informed of results and verbalized understanding.  

## 2013-09-16 NOTE — Telephone Encounter (Signed)
Called and informed pt that I received short term disability forms from his job and that that he would need to come in a sign a medical release form and pay a $25 charge in credit, debit, check, or money order. Pt verbalized understanding and said he would come in AM to fill out and pay.

## 2013-09-16 NOTE — Telephone Encounter (Signed)
Message copied by Burnice LoganATES, Floetta Brickey M on Thu Sep 16, 2013  8:58 AM ------      Message from: ToetervilleBRANCH, JONATHAN F      Created: Wed Sep 15, 2013  2:31 PM       Please let patient know that US of arm is normal            Dina RichJonathan Branch MD ------

## 2013-09-22 ENCOUNTER — Telehealth: Payer: Self-pay | Admitting: Cardiology

## 2013-09-22 NOTE — Telephone Encounter (Signed)
Received a message from Sal from [employer] [employer] (pt's employer) requesting STD forms be faxed to them. I called pt and informed pt that his employer has contacted our office requesting this information. I reminded the pt that the forms will not be submitted until he comes into our office and pays a $25 fee and sign a medical release form . Pt verbalized understanding and stated he would come fill out form and pay fee.

## 2014-05-12 ENCOUNTER — Encounter (HOSPITAL_COMMUNITY): Payer: Self-pay | Admitting: Cardiology

## 2014-06-14 ENCOUNTER — Telehealth: Payer: Self-pay | Admitting: Cardiology

## 2014-06-14 NOTE — Telephone Encounter (Signed)
Patient has been out of medication.  Stated that [facility] told him they have been faxing us since last Tuesday.

## 2014-06-14 NOTE — Telephone Encounter (Signed)
Pt is out of medication, does not want appt due to loss of job and insurance  And no transportation to get to an appt. [facility] and statin [facility]. Explained to pt that we could not refills medications that were started by another physician in another hospital. Pt refused to make appt. Offered pt [facility] pt assistance phone number to see if he could qualify for help while he is out of work. Pt is asking to please ask Dr. Wyline MoodBranch for refills. Will forward to Dr. Wyline MoodBranch but did explain that typically with no post cath appt and BP/statin medication [facility] by [facility] and no appt made that we would not refill medications. Suggested pt call assistance number given to possibly help with the situation.

## 2014-06-16 NOTE — Telephone Encounter (Signed)
What medications and doses specifically is he looking to have refilled?   Dominga FerryJ Gatlyn Lipari MD

## 2014-06-17 MED ORDER — CARVEDILOL 6.25 MG PO TABS: 6.2500 mg | ORAL_TABLET | Freq: Two times a day (BID) | ORAL | Status: AC

## 2014-06-17 MED ORDER — ATORVASTATIN CALCIUM 40 MG PO TABS: 40.0000 mg | ORAL_TABLET | Freq: Every day | ORAL | Status: DC

## 2014-06-17 MED ORDER — CARVEDILOL 25 MG PO TABS: ORAL_TABLET | ORAL | Status: DC

## 2014-06-17 NOTE — Addendum Note (Signed)
Addended by: Burman NievesASHWORTH, Amilya Haver T on: 06/17/2014 04:20 PM   Modules accepted: Orders, Medications

## 2014-06-17 NOTE — Telephone Encounter (Signed)
Change coreg from 25mg  1/4 of table to coreg 6.25mg  po bid. Make him aware of the change, its the same dose but he should not be cutting pill into quarters   Dominga FerryJ Branch MD

## 2014-06-17 NOTE — Addendum Note (Signed)
Addended by: Burman NievesASHWORTH, Dorothe Elmore T on: 06/17/2014 03:28 PM   Modules accepted: Orders, Medications

## 2014-06-17 NOTE — Telephone Encounter (Signed)
PT made aware, also spoke with pharmacy to confirm dosing of coreg 6.25 mg twice daily

## 2014-06-17 NOTE — Telephone Encounter (Signed)
Pt reported atorvastatin 1/2 tab 40mg .  Carvedilol 25 mg takes 1/4 tab twice daily. Will forward to Dr. Wyline MoodBranch

## 2014-06-17 NOTE — Telephone Encounter (Signed)
Ok to refill for 3 months his storvastatin and coreg, during that time he needs to establish with [facility] assistance or find an alternative provider. Please clarify coreg dosing, typically we don't write prescriptions as a 1/4 of a tablet. If he is on 6.25mg  bid then would write as that specifically. He should be off bisoprolol if he is taking coreg.    J ConocoPhillipsBranch

## 2014-06-17 NOTE — Telephone Encounter (Signed)
Pt is not taking Zebtia, and states that carvedilol bottle says 1/4 tabe twice daily 25 mg. Filled with 3 refills. Pt verbalized understanding that would need appt for further refills. Will forward to Dr. Wyline MoodBranch if OK to fill carvedilol as pt reported

## 2014-10-28 ENCOUNTER — Encounter: Payer: Self-pay | Admitting: Cardiology

## 2014-10-28 ENCOUNTER — Ambulatory Visit (INDEPENDENT_AMBULATORY_CARE_PROVIDER_SITE_OTHER): Payer: Self-pay | Admitting: Cardiology

## 2014-10-28 VITALS — BP 143/83 | HR 60 | Ht 66.0 in | Wt 322.0 lb

## 2014-10-28 DIAGNOSIS — I10 Essential (primary) hypertension: Secondary | ICD-10-CM

## 2014-10-28 DIAGNOSIS — E785 Hyperlipidemia, unspecified: Secondary | ICD-10-CM

## 2014-10-28 DIAGNOSIS — G4733 Obstructive sleep apnea (adult) (pediatric): Secondary | ICD-10-CM

## 2014-10-28 DIAGNOSIS — I2510 Atherosclerotic heart disease of native coronary artery without angina pectoris: Secondary | ICD-10-CM

## 2014-10-28 MED ORDER — ATORVASTATIN CALCIUM 40 MG PO TABS: 40.0000 mg | ORAL_TABLET | Freq: Every day | ORAL | Status: AC

## 2014-10-28 NOTE — Patient Instructions (Signed)
   Labs for CMET, Magnesium, CBC, FLP, TSH, HgA1c Office will contact with results via phone or letter.   Referral to Sleep medicine Your physician wants you to follow up in: 6 months.  You will receive a reminder letter in the mail one-two months in advance.  If you don't receive a letter, please call our office to schedule the follow up appointment

## 2014-10-28 NOTE — Progress Notes (Signed)
Clinical Summary [name] is a 44 y.o.male seen today for follow up of the following medical problems.   1. CAD/Chest pain:  - prior stent Feb 2012 to LCX  - negative nuclear exercise stress in 2013 - last cath 09/2013 with mild non-obstructive CAD, patent stent in LCX. LVEF 55-65%.   - episode of stabbing chest pain midchest, lasted 45 minutes. Better with NG. Worst with deep breathing and coughing. No other associated symptom  - compliant with meds, though ran out of lipitor a few weeks ago  2. Hyperlipidemia  - no recent panel in our sytem - compliant with statin  3. OSA screen + snoring, no apneic episode, + daytime somnolence  Past Medical History  Diagnosis Date  . Tobacco use disorder   . Obesity, unspecified   . Tietze's disease   . History of migraine headaches     Takes excedrin  . Dyslipidemia   . Essential hypertension, benign   . CAD (coronary artery disease) 07/12/2010        CARDIAC CATHETERIZATION,  Status post bare-metal stent to the circumflex   . Rash     Plavix, February, 2012  . Ejection fraction     EF 60-65%, echo, February, 2012  //   EF 60%, nuclear, July, 2013  . Dizziness     September, 2013, probable vertigo     Allergies  Allergen Reactions  . Plavix [Clopidogrel Bisulfate] Rash     Current Outpatient Prescriptions  Medication Sig Dispense Refill  . amLODipine (NORVASC) 5 MG tablet Take 1 tablet (5 mg total) by mouth daily. 30 tablet 6  . aspirin 81 MG tablet Take 81 mg by mouth daily.      Marland Kitchen. atorvastatin (LIPITOR) 40 MG tablet Take 1 tablet (40 mg total) by mouth daily. 90 tablet 0  . carvedilol (COREG) 6.25 MG tablet Take 1 tablet (6.25 mg total) by mouth 2 (two) times daily. 60 tablet 3  . nitroGLYCERIN (NITROSTAT) 0.4 MG SL tablet Place 1 tablet (0.4 mg total) under the tongue every 5 (five) minutes as needed for chest pain. 25 tablet 3   No current facility-administered medications for this visit.     Past Surgical  History  Procedure Laterality Date  . Cardiac catheterization  07/13/2010    Occludded mid LCX, LAD: 30% proximal and 50% distal, RCA 30%. Normal EF by echo  . Coronary angioplasty  07/13/2010    Mid LCX: 3.0 X 18 mm Vision BME  . Left heart catheterization with coronary angiogram N/A 09/01/2013    Procedure: LEFT HEART CATHETERIZATION WITH CORONARY ANGIOGRAM;  Surgeon: Alexander M SwazilandJordan, MD;  [facility];  Service: Cardiovascular;  Laterality: N/A;     Allergies  Allergen Reactions  . Plavix [Clopidogrel Bisulfate] Rash      Family History  Problem Relation Age of Onset  . Coronary artery disease Other     family history      Social History [name] reports that he has been smoking Cigarettes.  He has a 12.5 pack-year smoking history. He has never used smokeless tobacco. [name] reports that he drinks alcohol.   Review of Systems CONSTITUTIONAL: No weight loss, fever, chills, weakness or fatigue.  HEENT: Eyes: No visual loss, blurred vision, double vision or yellow sclerae.No hearing loss, sneezing, congestion, runny nose or sore throat.  SKIN: No rash or itching.  CARDIOVASCULAR: per HPI RESPIRATORY: No shortness of breath, cough or sputum.  GASTROINTESTINAL: No anorexia, nausea,  vomiting or diarrhea. No abdominal pain or blood.  GENITOURINARY: No burning on urination, no polyuria NEUROLOGICAL: No headache, dizziness, syncope, paralysis, ataxia, numbness or tingling in the extremities. No change in bowel or bladder control.  MUSCULOSKELETAL: No muscle, back pain, joint pain or stiffness.  LYMPHATICS: No enlarged nodes. No history of splenectomy.  PSYCHIATRIC: No history of depression or anxiety.  ENDOCRINOLOGIC: No reports of sweating, cold or heat intolerance. No polyuria or polydipsia.  Marland Kitchen   Physical Examination Filed Vitals:   10/28/14 0820  BP: 143/83  Pulse: 60   Filed Vitals:   10/28/14 0820  Height:  (1.676 m)  Weight: 322 lb (146.058 kg)      Gen: resting comfortably, no acute distress HEENT: no scleral icterus, pupils equal round and reactive, no palptable cervical adenopathy,  CV: RRR, no m/r/g, no JVD Resp: Clear to auscultation bilaterally GI: abdomen is soft, non-tender, non-distended, normal bowel sounds, no hepatosplenomegaly MSK: extremities are warm, no edema.  Skin: warm, no rash Neuro:  no focal deficits Psych: appropriate affect   Diagnostic Studies 01/2012 Exc Nuclear: 10.4 METs, 89% THR, no perfusion defects.   09/2013 Cath Procedural Findings: Hemodynamics: AO 154/91 mean 117 mm Hg LV 160/21 mm Hg  Coronary angiography: Coronary dominance: right  Left mainstem: Normal  Left anterior descending (LAD): Mild disease in the mid vessel up to 20%.   Left circumflex (LCx): The stent in the proximal vessel is patent with 20-30% disease in the distal stent and in the native vessel just distal to the stent.  Right coronary artery (RCA): mild irregularities in the mid vessel to 10-20%  Left ventriculography: Left ventricular systolic function is normal, LVEF is estimated at 55-65%, there is no significant mitral regurgitation   Final Conclusions:  1. Mild nonobstructive CAD, patent stent in the LCx. 2. Normal LV function.  Recommendations: Continue medical management.  Assessment and Plan  1. Chest pain/CAD - has had intermittent chest pain on and off for a few years now, recent [facility] 09/2013 showed non-obstructive CAD with patent stent.  - continue current medical therapy  2. Hyperlipidemia - repeat fasting lipid panel - continue current statin  3. OSA screen - signs and symptoms of possible OSA - refer for sleep evalatuion     Alexander Shannon, M.D.

## 2014-12-06 ENCOUNTER — Encounter: Payer: Self-pay | Admitting: *Deleted

## 2015-01-24 ENCOUNTER — Institutional Professional Consult (permissible substitution): Payer: Self-pay | Admitting: Internal Medicine

## 2015-10-31 ENCOUNTER — Encounter (HOSPITAL_COMMUNITY): Payer: Self-pay | Admitting: Emergency Medicine

## 2015-10-31 ENCOUNTER — Emergency Department (HOSPITAL_COMMUNITY): Payer: Self-pay

## 2015-10-31 ENCOUNTER — Emergency Department (HOSPITAL_COMMUNITY)
Admission: EM | Admit: 2015-10-31 | Discharge: 2015-10-31 | Disposition: A | Discharge status: Home / Self Care | Payer: Self-pay | Attending: Emergency Medicine | Admitting: Emergency Medicine

## 2015-10-31 DIAGNOSIS — F17210 Nicotine dependence, cigarettes, uncomplicated: Secondary | ICD-10-CM | POA: Insufficient documentation

## 2015-10-31 DIAGNOSIS — I2510 Atherosclerotic heart disease of native coronary artery without angina pectoris: Secondary | ICD-10-CM | POA: Insufficient documentation

## 2015-10-31 DIAGNOSIS — Z6841 Body Mass Index (BMI) 40.0 and over, adult: Secondary | ICD-10-CM | POA: Insufficient documentation

## 2015-10-31 DIAGNOSIS — E785 Hyperlipidemia, unspecified: Secondary | ICD-10-CM | POA: Insufficient documentation

## 2015-10-31 DIAGNOSIS — X19XXXA Contact with other heat and hot substances, initial encounter: Secondary | ICD-10-CM | POA: Insufficient documentation

## 2015-10-31 DIAGNOSIS — I10 Essential (primary) hypertension: Secondary | ICD-10-CM | POA: Insufficient documentation

## 2015-10-31 DIAGNOSIS — Z79899 Other long term (current) drug therapy: Secondary | ICD-10-CM | POA: Insufficient documentation

## 2015-10-31 DIAGNOSIS — Y939 Activity, unspecified: Secondary | ICD-10-CM | POA: Insufficient documentation

## 2015-10-31 DIAGNOSIS — Y929 Unspecified place or not applicable: Secondary | ICD-10-CM | POA: Insufficient documentation

## 2015-10-31 DIAGNOSIS — M79675 Pain in left toe(s): Secondary | ICD-10-CM | POA: Insufficient documentation

## 2015-10-31 DIAGNOSIS — Z7982 Long term (current) use of aspirin: Secondary | ICD-10-CM | POA: Insufficient documentation

## 2015-10-31 DIAGNOSIS — Y999 Unspecified external cause status: Secondary | ICD-10-CM | POA: Insufficient documentation

## 2015-10-31 DIAGNOSIS — E669 Obesity, unspecified: Secondary | ICD-10-CM | POA: Insufficient documentation

## 2015-10-31 MED ORDER — CEPHALEXIN 500 MG PO CAPS
500.0000 mg | ORAL_CAPSULE | Freq: Once | ORAL | Status: AC
Start: 2015-10-31 — End: 2015-10-31
  Administered 2015-10-31: 500 mg via ORAL
  Filled 2015-10-31: qty 1

## 2015-10-31 MED ORDER — DOXYCYCLINE HYCLATE 100 MG PO CAPS: 100.0000 mg | ORAL_CAPSULE | Freq: Two times a day (BID) | ORAL | Status: DC

## 2015-10-31 MED ORDER — SULFAMETHOXAZOLE-TRIMETHOPRIM 800-160 MG PO TABS: 1.0000 | ORAL_TABLET | Freq: Two times a day (BID) | ORAL | Status: AC

## 2015-10-31 MED ORDER — CEPHALEXIN 500 MG PO CAPS: 500.0000 mg | ORAL_CAPSULE | Freq: Four times a day (QID) | ORAL | Status: DC

## 2015-10-31 MED ORDER — SULFAMETHOXAZOLE-TRIMETHOPRIM 800-160 MG PO TABS
1.0000 | ORAL_TABLET | Freq: Once | ORAL | Status: AC
  Administered 2015-10-31: 1 via ORAL
  Filled 2015-10-31: qty 1

## 2015-10-31 MED ORDER — OXYCODONE-ACETAMINOPHEN 5-325 MG PO TABS
2.0000 | ORAL_TABLET | Freq: Once | ORAL | Status: AC
  Administered 2015-10-31: 2 via ORAL
  Filled 2015-10-31: qty 2

## 2015-10-31 NOTE — Discharge Instructions (Signed)
Mr. Bernadene PersonFloyd W Menge,  Nice meeting you! Please follow-up with a primary care provider and orthopedics as needed. Take your antibiotics with food. Return to the emergency department if the discolorations become worse, you develop numbness, nausea/vomiting, new/worsening symptoms. Feel better soon!  S. Lane HackerNicole Nikole Swartzentruber, PA-C

## 2015-10-31 NOTE — ED Notes (Signed)
Pt reports LT foot pain after injuring it in a hot tub 5 weeks ago. States he has bruising on his pinky toe. States that [facility], xrays were performed and he was told there was no fracture. States that pain has not improved.

## 2015-11-10 NOTE — ED Provider Notes (Signed)
CSN: [id number]     Arrival date & time 10/31/15  1622 History   First MD Initiated Contact with Patient 10/31/15 1703     Chief Complaint  Patient presents with  . Foot Injury   HPI  Alexander Shannon is a 45 y.o. male presenting with a 5 week history of left small toe pain. He states he injured his pinky toe in a hot tub after "stubbing" it on a plastic drain that was raised up. He denies fevers, chills, N/V, changes in bowel/bladder habits. No history of DM.    Past Medical History  Diagnosis Date  . Tobacco use disorder   . Obesity, unspecified   . Tietze's disease   . History of migraine headaches     Takes excedrin  . Dyslipidemia   . Essential hypertension, benign   . CAD (coronary artery disease) 07/12/2010        CARDIAC CATHETERIZATION,  Status post bare-metal stent to the circumflex   . Rash     Plavix, February, 2012  . Ejection fraction     EF 60-65%, echo, February, 2012  //   EF 60%, nuclear, July, 2013  . Dizziness     September, 2013, probable vertigo   Past Surgical History  Procedure Laterality Date  . Cardiac catheterization  07/13/2010    Occludded mid LCX, LAD: 30% proximal and 50% distal, RCA 30%. Normal EF by echo  . Coronary angioplasty  07/13/2010    Mid LCX: 3.0 X 18 mm Vision BME  . Left heart catheterization with coronary angiogram N/A 09/01/2013    Procedure: LEFT HEART CATHETERIZATION WITH CORONARY ANGIOGRAM;  Surgeon: Peter M SwazilandJordan, MD;  [facility];  Service: Cardiovascular;  Laterality: N/A;   Family History  Problem Relation Age of Onset  . Coronary artery disease Other     family history    Social History  Substance Use Topics  . Smoking status: Current Every Day Smoker -- 0.50 packs/day for 25 years    Types: Cigarettes    Start date: 03/04/1986  . Smokeless tobacco: Never Used     Comment: SMOKING CESSATION COUNSELING Started smoking age 45   . Alcohol Use: 0.0 oz/week    0 Standard drinks or equivalent per week   Comment: drinks beer on occasion    Review of Systems  Ten systems are reviewed and are negative for acute change except as noted in the HPI  Allergies  Plavix  Home Medications   Prior to Admission medications   Medication Sig Start Date End Date Taking? Authorizing Provider  aspirin 81 MG tablet Take 81 mg by mouth daily.     Yes Historical Provider, MD  Multiple Vitamin (MULTIVITAMIN WITH MINERALS) TABS tablet Take 1 tablet by mouth daily.   Yes Historical Provider, MD  UNKNOWN TO PATIENT Take 1 tablet by mouth daily.   Yes Historical Provider, MD  atorvastatin (LIPITOR) 40 MG tablet Take 1 tablet (40 mg total) by mouth daily. 10/28/14   Antoine PocheJonathan F Branch, MD  carvedilol (COREG) 6.25 MG tablet Take 1 tablet (6.25 mg total) by mouth 2 (two) times daily. 06/17/14   Antoine PocheJonathan F Branch, MD  cephALEXin (KEFLEX) 500 MG capsule Take 1 capsule (500 mg total) by mouth 4 (four) times daily. 10/31/15   Melton KrebsSamantha Nicole Laelyn Blumenthal, PA-C  nitroGLYCERIN (NITROSTAT) 0.4 MG SL tablet Place 1 tablet (0.4 mg total) under the tongue every 5 (five) minutes as needed for chest pain. 08/26/13   Dorothe PeaJonathan F  Branch, MD   BP 146/81 mmHg  Pulse 85  Temp(Src) 98.4 F (36.9 C) (Oral)  Resp 18  Ht  (1.676 m)  Wt 140.615 kg  BMI 50.06 kg/m2  SpO2 100% Physical Exam  Constitutional: He appears well-developed and well-nourished. No distress.  HENT:  Head: Normocephalic and atraumatic.  Eyes: Conjunctivae are normal. Right eye exhibits no discharge. Left eye exhibits no discharge. No scleral icterus.  Neck: No tracheal deviation present.  Cardiovascular: Normal rate and intact distal pulses.   Pulmonary/Chest: Effort normal and breath sounds normal. No respiratory distress.  Abdominal: Soft. Bowel sounds are normal. He exhibits no distension.  Musculoskeletal: Normal range of motion. He exhibits tenderness. He exhibits no edema.  Discolorations at left 5th toe. Slightly erythematous without drainage. Tenderness  along toe diffusely. NVI  Lymphadenopathy:    He has no cervical adenopathy.  Neurological: He is alert. Coordination normal.  Skin: Skin is warm and dry. No rash noted. He is not diaphoretic. No erythema.  Psychiatric: He has a normal mood and affect. His behavior is normal.  Nursing note and vitals reviewed.   ED Course  Procedures  Imaging Review Dg Foot Complete Left  10/31/2015  CLINICAL DATA:  Injury 5 weeks ago.  Bruising fifth toe. EXAM: LEFT FOOT - COMPLETE 3+ VIEW COMPARISON:  10/09/2015 FINDINGS: Possible nondisplaced fracture of the distal fifth phalanx although this could be within normal limits. No change from the prior study. Degenerative change in the first interphalangeal joint possible accessory ossicle in the joint. Calcaneal spurring at the Achilles tendon insertion. IMPRESSION: Possible nondisplaced fracture fifth distal phalanx although this could be within normal limits. Otherwise no acute fracture. Electronically Signed   By: Marlan Palau M.D.   On: 10/31/2015 17:31   I have personally reviewed and evaluated these images and lab results as part of my medical decision-making.   MDM   Final diagnoses:  Pain in toe of left foot   Post op boot and buddy taped. Patient to follow-up with orthopedics and PCP. Gave abx in the event this is cellulitis that has been worsening for 5 weeks. Patient may be safely discharged home. Discussed reasons for return. Patient to follow-up with primary care provider within one week. Patient in understanding and agreement with the plan.   Melton Krebs, PA-C 11/10/15 1339  Marily Memos, MD 11/10/15 1500

## 2015-11-18 ENCOUNTER — Encounter (HOSPITAL_COMMUNITY): Payer: Self-pay | Admitting: Emergency Medicine

## 2015-11-18 ENCOUNTER — Emergency Department (HOSPITAL_COMMUNITY)
Admission: EM | Admit: 2015-11-18 | Discharge: 2015-11-18 | Disposition: A | Discharge status: Home / Self Care | Payer: Self-pay | Attending: Emergency Medicine | Admitting: Emergency Medicine

## 2015-11-18 ENCOUNTER — Emergency Department (HOSPITAL_COMMUNITY): Payer: Self-pay

## 2015-11-18 DIAGNOSIS — I10 Essential (primary) hypertension: Secondary | ICD-10-CM | POA: Insufficient documentation

## 2015-11-18 DIAGNOSIS — L02612 Cutaneous abscess of left foot: Secondary | ICD-10-CM | POA: Insufficient documentation

## 2015-11-18 DIAGNOSIS — F17210 Nicotine dependence, cigarettes, uncomplicated: Secondary | ICD-10-CM | POA: Insufficient documentation

## 2015-11-18 DIAGNOSIS — Z7982 Long term (current) use of aspirin: Secondary | ICD-10-CM | POA: Insufficient documentation

## 2015-11-18 DIAGNOSIS — I2510 Atherosclerotic heart disease of native coronary artery without angina pectoris: Secondary | ICD-10-CM | POA: Insufficient documentation

## 2015-11-18 DIAGNOSIS — E785 Hyperlipidemia, unspecified: Secondary | ICD-10-CM | POA: Insufficient documentation

## 2015-11-18 LAB — CBC WITH DIFFERENTIAL/PLATELET
Basophils Absolute: 0 10*3/uL (ref 0.0–0.1)
Basophils Relative: 0 %
EOS PCT: 3 %
Eosinophils Absolute: 0.3 10*3/uL (ref 0.0–0.7)
HCT: 46.2 % (ref 39.0–52.0)
Hemoglobin: 15.4 g/dL (ref 13.0–17.0)
LYMPHS PCT: 24 %
Lymphs Abs: 2.5 10*3/uL (ref 0.7–4.0)
MCH: 29.7 pg (ref 26.0–34.0)
MCHC: 33.3 g/dL (ref 30.0–36.0)
MCV: 89.2 fL (ref 78.0–100.0)
MONOS PCT: 6 %
Monocytes Absolute: 0.6 10*3/uL (ref 0.1–1.0)
Neutro Abs: 7.1 10*3/uL (ref 1.7–7.7)
Neutrophils Relative %: 67 %
PLATELETS: 222 10*3/uL (ref 150–400)
RBC: 5.18 MIL/uL (ref 4.22–5.81)
RDW: 13.3 % (ref 11.5–15.5)
WBC: 10.6 10*3/uL — ABNORMAL HIGH (ref 4.0–10.5)

## 2015-11-18 LAB — BASIC METABOLIC PANEL
Anion gap: 5 (ref 5–15)
BUN: 14 mg/dL (ref 6–20)
CALCIUM: 8.8 mg/dL — AB (ref 8.9–10.3)
CO2: 23 mmol/L (ref 22–32)
Chloride: 106 mmol/L (ref 101–111)
Creatinine, Ser: 1.17 mg/dL (ref 0.61–1.24)
GFR calc Af Amer: >60 mL/min (ref >60)
GLUCOSE: 171 mg/dL — AB (ref 65–99)
Potassium: 4.4 mmol/L (ref 3.5–5.1)
Sodium: 134 mmol/L — ABNORMAL LOW (ref 135–145)

## 2015-11-18 MED ORDER — CEPHALEXIN 500 MG PO CAPS: 500.0000 mg | ORAL_CAPSULE | Freq: Four times a day (QID) | ORAL | Status: AC

## 2015-11-18 MED ORDER — OXYCODONE-ACETAMINOPHEN 5-325 MG PO TABS: 1.0000 | ORAL_TABLET | ORAL | Status: AC | PRN

## 2015-11-18 MED ORDER — SULFAMETHOXAZOLE-TRIMETHOPRIM 800-160 MG PO TABS: 1.0000 | ORAL_TABLET | Freq: Two times a day (BID) | ORAL | Status: AC

## 2015-11-18 MED ORDER — VANCOMYCIN HCL 500 MG IV SOLR
INTRAVENOUS | Status: AC
  Filled 2015-11-18: qty 500

## 2015-11-18 MED ORDER — VANCOMYCIN HCL 500 MG IV SOLR
500.0000 mg | Freq: Once | INTRAVENOUS | Status: AC
  Administered 2015-11-18: 500 mg via INTRAVENOUS
  Filled 2015-11-18: qty 500

## 2015-11-18 MED ORDER — VANCOMYCIN HCL 10 G IV SOLR
1500.0000 mg | Freq: Once | INTRAVENOUS | Status: DC
  Filled 2015-11-18: qty 1500

## 2015-11-18 MED ORDER — SODIUM CHLORIDE 0.9 % IV BOLUS (SEPSIS)
500.0000 mL | Freq: Once | INTRAVENOUS | Status: AC
  Administered 2015-11-18: 500 mL via INTRAVENOUS

## 2015-11-18 MED ORDER — HYDROMORPHONE HCL 1 MG/ML IJ SOLN
1.0000 mg | Freq: Once | INTRAMUSCULAR | Status: AC
  Administered 2015-11-18: 1 mg via INTRAVENOUS
  Filled 2015-11-18: qty 1

## 2015-11-18 MED ORDER — VANCOMYCIN HCL IN DEXTROSE 1-5 GM/200ML-% IV SOLN
1000.0000 mg | Freq: Three times a day (TID) | INTRAVENOUS | Status: DC
  Administered 2015-11-18: 1000 mg via INTRAVENOUS
  Filled 2015-11-18: qty 200

## 2015-11-18 MED ORDER — LIDOCAINE HCL (PF) 2 % IJ SOLN
10.0000 mL | Freq: Once | INTRAMUSCULAR | Status: AC
  Administered 2015-11-18: 10 mL
  Filled 2015-11-18: qty 10

## 2015-11-18 NOTE — ED Notes (Addendum)
Patient c/o infection to left foot/and little toe. Per patient has had pain in left foot since injury in April. Patient seen here on 5/30 and given antibiotics. Per patient has only been taking them x1 week due to delay in getting prescription filled. Patient states wound appeared on little finger and he opened wound with safety pin in which it drained serosanguinous fluid. Patient states now to is swollen and numb. Patient also reports using neosporin. Denies any fevers.

## 2015-11-18 NOTE — ED Notes (Signed)
Patient verbalizes understanding of discharge instructions, prescriptions and home care. Patient out of department at this time.

## 2015-11-18 NOTE — ED Notes (Signed)
[facility]

## 2015-11-18 NOTE — Progress Notes (Signed)
ANTIBIOTIC CONSULT NOTE-Preliminary  Pharmacy Consult for Vancomycin Indication: cellulitis  Allergies  Allergen Reactions  . Plavix [Clopidogrel Bisulfate] Rash   Patient Measurements: Height: 5\' 6"  (167.6 cm) Weight: (!) 315 lb (142.883 kg) IBW/kg (Calculated) : 63.8  Vital Signs: Temp: 98.2 F (36.8 C) (06/17 1312) Temp Source: Oral (06/17 1312) BP: 153/82 mmHg (06/17 1312) Pulse Rate: 63 (06/17 1312)  Labs: No results for input(s): WBC, HGB, PLT, LABCREA, CREATININE in the last 72 hours.  CrCl cannot be calculated (Patient has no serum creatinine result on file.).  No results for input(s): VANCOTROUGH, VANCOPEAK, VANCORANDOM, GENTTROUGH, GENTPEAK, GENTRANDOM, TOBRATROUGH, TOBRAPEAK, TOBRARND, AMIKACINPEAK, AMIKACINTROU, AMIKACIN in the last 72 hours.   Microbiology: No results found for this or any previous visit (from the past 720 hour(s)).  Medical History: Past Medical History  Diagnosis Date  . Tobacco use disorder   . Obesity, unspecified   . Tietze's disease   . History of migraine headaches     Takes excedrin  . Dyslipidemia   . Essential hypertension, benign   . CAD (coronary artery disease) 07/12/2010        CARDIAC CATHETERIZATION,  Status post bare-metal stent to the circumflex   . Rash     Plavix, February, 2012  . Ejection fraction     EF 60-65%, echo, February, 2012  //   EF 60%, nuclear, July, 2013  . Dizziness     September, 2013, probable vertigo   Assessment: 44yo morbidly obese male.  Asked to initiate Vancomycin for cellulitis.   Goal of Therapy:  Vancomycin trough level 10-15 mcg/ml  Plan:  Preliminary review of pertinent patient information completed.  Protocol will be initiated with a one-time dose(s) of Vancomycin 1500mg .  F/U labs when available to order clarify maintenance dose.  Valrie HartHall, Zayonna Ayuso A, RPH 11/18/2015,1:51 PM

## 2015-11-18 NOTE — ED Provider Notes (Signed)
CSN: [id number]     Arrival date & time 11/18/15  1259 History   First MD Initiated Contact with Patient 11/18/15 1305     Chief Complaint  Patient presents with  . Wound Infection     (Consider location/radiation/quality/duration/timing/severity/associated sxs/prior Treatment) HPI.Marland Kitchen.Marland Kitchen.Marland Kitchen.Pyt c/o persistent infection in left baby toe for multiple days. Patient was evaluated here on May 30 for an infected toe. He was placed on Keflex and Septra, but it was unable to fill his prescription until 5 days ago. He now presents with persistent pain and pus discharge from the toe. No fever, sweats, chills.  Past Medical History  Diagnosis Date  . Tobacco use disorder   . Obesity, unspecified   . Tietze's disease   . History of migraine headaches     Takes excedrin  . Dyslipidemia   . Essential hypertension, benign   . CAD (coronary artery disease) 07/12/2010        CARDIAC CATHETERIZATION,  Status post bare-metal stent to the circumflex   . Rash     Plavix, February, 2012  . Ejection fraction     EF 60-65%, echo, February, 2012  //   EF 60%, nuclear, July, 2013  . Dizziness     September, 2013, probable vertigo   Past Surgical History  Procedure Laterality Date  . Cardiac catheterization  07/13/2010    Occludded mid LCX, LAD: 30% proximal and 50% distal, RCA 30%. Normal EF by echo  . Coronary angioplasty  07/13/2010    Mid LCX: 3.0 X 18 mm Vision BME  . Left heart catheterization with coronary angiogram N/A 09/01/2013    Procedure: LEFT HEART CATHETERIZATION WITH CORONARY ANGIOGRAM;  Surgeon: Peter M SwazilandJordan, MD;  [facility];  Service: Cardiovascular;  Laterality: N/A;   Family History  Problem Relation Age of Onset  . Coronary artery disease Other     family history    Social History  Substance Use Topics  . Smoking status: Current Every Day Smoker -- 0.50 packs/day for 25 years    Types: Cigarettes    Start date: 03/04/1986  . Smokeless tobacco: Never Used     Comment:  SMOKING CESSATION COUNSELING Started smoking age 45   . Alcohol Use: 0.0 oz/week    0 Standard drinks or equivalent per week     Comment: drinks beer on occasion    Review of Systems  All other systems reviewed and are negative.     Allergies  Plavix  Home Medications   Prior to Admission medications   Medication Sig Start Date End Date Taking? Authorizing Provider  aspirin 81 MG tablet Take 81 mg by mouth daily.      Historical Provider, MD  atorvastatin (LIPITOR) 40 MG tablet Take 1 tablet (40 mg total) by mouth daily. 10/28/14   Antoine PocheJonathan F Branch, MD  carvedilol (COREG) 6.25 MG tablet Take 1 tablet (6.25 mg total) by mouth 2 (two) times daily. 06/17/14   Antoine PocheJonathan F Branch, MD  cephALEXin (KEFLEX) 500 MG capsule Take 1 capsule (500 mg total) by mouth 4 (four) times daily. 11/18/15   Donnetta HutchingBrian Camerin Ladouceur, MD  Multiple Vitamin (MULTIVITAMIN WITH MINERALS) TABS tablet Take 1 tablet by mouth daily.    Historical Provider, MD  nitroGLYCERIN (NITROSTAT) 0.4 MG SL tablet Place 1 tablet (0.4 mg total) under the tongue every 5 (five) minutes as needed for chest pain. 08/26/13   Antoine PocheJonathan F Branch, MD  oxyCODONE-acetaminophen (PERCOCET) 5-325 MG tablet Take 1-2 tablets by mouth every 4 (  four) hours as needed. 11/18/15   Donnetta Hutching, MD  sulfamethoxazole-trimethoprim (BACTRIM DS,SEPTRA DS) 800-160 MG tablet Take 1 tablet by mouth 2 (two) times daily. 11/18/15 11/25/15  Donnetta Hutching, MD  UNKNOWN TO PATIENT Take 1 tablet by mouth daily.    Historical Provider, MD   BP 136/74 mmHg  Pulse 54  Temp(Src) 98 F (36.7 C) (Oral)  Resp 18  Ht  (1.676 m)  Wt 315 lb (142.883 kg)  BMI 50.87 kg/m2  SpO2 95% Physical Exam  Constitutional: He is oriented to person, place, and time.  Obese, no acute distress  HENT:  Head: Normocephalic and atraumatic.  Eyes: Conjunctivae and EOM are normal. Pupils are equal, round, and reactive to light.  Neck: Normal range of motion. Neck supple.  Cardiovascular: Normal rate and  regular rhythm.   Pulmonary/Chest: Effort normal and breath sounds normal.  Abdominal: Soft. Bowel sounds are normal.  Musculoskeletal: Normal range of motion.  Neurological: He is alert and oriented to person, place, and time.  Skin: Skin is warm and dry.  Psychiatric: He has a normal mood and affect. His behavior is normal.  Nursing note and vitals reviewed.   ED Course  .Marland KitchenIncision and Drainage Date/Time: 11/18/2015 3:36 PM Performed by: Donnetta Hutching Authorized by: Donnetta Hutching Consent: Verbal consent obtained. Risks and benefits: risks, benefits and alternatives were discussed Consent given by: patient Patient understanding: patient states understanding of the procedure being performed Relevant documents: relevant documents present and verified Comments: 1500:   (2)  Digital block of left baby toe performed with 2% Xylocaine 7 mL. Good anesthesia. (2)  #11 stab blade made on tip of toe. Minimal to moderate pus expressed. Wound was opened with hemostat and loculations broken up. Patient tolerated procedure well.   (including critical care time) Labs Review Labs Reviewed  CBC WITH DIFFERENTIAL/PLATELET - Abnormal; Notable for the following:    WBC 10.6 (*)    All other components within normal limits  BASIC METABOLIC PANEL - Abnormal; Notable for the following:    Sodium 134 (*)    Glucose, Bld 171 (*)    Calcium 8.8 (*)    All other components within normal limits    Imaging Review Dg Foot Complete Left  11/18/2015  CLINICAL DATA:  Left foot infection. EXAM: LEFT FOOT - COMPLETE 3+ VIEW COMPARISON:  Radiographs of Oct 31, 2015. FINDINGS: There is no evidence of fracture or dislocation. There is no evidence of arthropathy or other focal bone abnormality. Soft tissues are unremarkable. IMPRESSION: Normal left foot. Electronically Signed   By: Lupita Raider, M.D.   On: 11/18/2015 14:39   I have personally reviewed and evaluated these images and lab results as part of my medical  decision-making.   EKG Interpretation None      MDM   Final diagnoses:  Abscess of left foot    Patient has obvious abscess of left baby toe. No evidence of osteomyelitis. Incision and drainage performed. Discussed elevated blood sugar. Will continue Keflex and Septra. Percocet for pain. Patient will return if worse in anyway.    Donnetta Hutching, MD 11/18/15 1539

## 2015-11-18 NOTE — Discharge Instructions (Signed)
Abscess An abscess (boil or furuncle) is an infected area on or under the skin. This area is filled with yellowish-white fluid (pus) and other material (debris). HOME CARE   Only take medicines as told by your doctor.  If you were given antibiotic medicine, take it as directed. Finish the medicine even if you start to feel better.  If gauze is used, follow your doctor's directions for changing the gauze.  To avoid spreading the infection:  Keep your abscess covered with a bandage.  Wash your hands well.  Do not share personal care items, towels, or whirlpools with others.  Avoid skin contact with others.  Keep your skin and clothes clean around the abscess.  Keep all doctor visits as told. GET HELP RIGHT AWAY IF:   You have more pain, puffiness (swelling), or redness in the wound site.  You have more fluid or blood coming from the wound site.  You have muscle aches, chills, or you feel sick.  You have a fever. MAKE SURE YOU:   Understand these instructions.  Will watch your condition.  Will get help right away if you are not doing well or get worse.   This information is not intended to replace advice given to you by your health care provider. Make sure you discuss any questions you have with your health care provider.   Document Released: 11/06/2007 Document Revised: 11/19/2011 Document Reviewed: 08/03/2011 Elsevier Interactive Patient Education 2016 [employer]   Soap in warm salt water twice a day.  Antibiotic ointment. Simple dressing with Band-Aid. Clean socks. Continue antibiotic for 10 more days. Prescription for pain medicine. Return here if worse in anyway

## 2015-12-03 ENCOUNTER — Encounter (HOSPITAL_COMMUNITY): Payer: Self-pay | Admitting: Emergency Medicine

## 2015-12-03 ENCOUNTER — Emergency Department (HOSPITAL_COMMUNITY): Payer: Self-pay

## 2015-12-03 ENCOUNTER — Emergency Department (HOSPITAL_COMMUNITY)
Admission: EM | Admit: 2015-12-03 | Discharge: 2015-12-03 | Disposition: A | Discharge status: Home / Self Care | Payer: Self-pay | Attending: Emergency Medicine | Admitting: Emergency Medicine

## 2015-12-03 DIAGNOSIS — I10 Essential (primary) hypertension: Secondary | ICD-10-CM | POA: Insufficient documentation

## 2015-12-03 DIAGNOSIS — I2510 Atherosclerotic heart disease of native coronary artery without angina pectoris: Secondary | ICD-10-CM | POA: Insufficient documentation

## 2015-12-03 DIAGNOSIS — E669 Obesity, unspecified: Secondary | ICD-10-CM | POA: Insufficient documentation

## 2015-12-03 DIAGNOSIS — Z6841 Body Mass Index (BMI) 40.0 and over, adult: Secondary | ICD-10-CM | POA: Insufficient documentation

## 2015-12-03 DIAGNOSIS — E785 Hyperlipidemia, unspecified: Secondary | ICD-10-CM | POA: Insufficient documentation

## 2015-12-03 DIAGNOSIS — Z7982 Long term (current) use of aspirin: Secondary | ICD-10-CM | POA: Insufficient documentation

## 2015-12-03 DIAGNOSIS — Z79899 Other long term (current) drug therapy: Secondary | ICD-10-CM | POA: Insufficient documentation

## 2015-12-03 DIAGNOSIS — F17210 Nicotine dependence, cigarettes, uncomplicated: Secondary | ICD-10-CM | POA: Insufficient documentation

## 2015-12-03 DIAGNOSIS — L089 Local infection of the skin and subcutaneous tissue, unspecified: Secondary | ICD-10-CM | POA: Insufficient documentation

## 2015-12-03 DIAGNOSIS — G629 Polyneuropathy, unspecified: Secondary | ICD-10-CM | POA: Insufficient documentation

## 2015-12-03 LAB — CBC WITH DIFFERENTIAL/PLATELET
BASOS ABS: 0 10*3/uL (ref 0.0–0.1)
BASOS PCT: 0 %
Eosinophils Absolute: 0.5 10*3/uL (ref 0.0–0.7)
Eosinophils Relative: 7 %
HEMATOCRIT: 43.1 % (ref 39.0–52.0)
HEMOGLOBIN: 14.3 g/dL (ref 13.0–17.0)
LYMPHS PCT: 30 %
Lymphs Abs: 2.5 10*3/uL (ref 0.7–4.0)
MCH: 29.6 pg (ref 26.0–34.0)
MCHC: 33.2 g/dL (ref 30.0–36.0)
MCV: 89.2 fL (ref 78.0–100.0)
MONO ABS: 0.6 10*3/uL (ref 0.1–1.0)
MONOS PCT: 7 %
NEUTROS ABS: 4.6 10*3/uL (ref 1.7–7.7)
NEUTROS PCT: 56 %
Platelets: 212 10*3/uL (ref 150–400)
RBC: 4.83 MIL/uL (ref 4.22–5.81)
RDW: 13.2 % (ref 11.5–15.5)
WBC: 8.2 10*3/uL (ref 4.0–10.5)

## 2015-12-03 LAB — BASIC METABOLIC PANEL
Anion gap: 8 (ref 5–15)
BUN: 13 mg/dL (ref 6–20)
CALCIUM: 8.3 mg/dL — AB (ref 8.9–10.3)
CO2: 24 mmol/L (ref 22–32)
Chloride: 104 mmol/L (ref 101–111)
Creatinine, Ser: 0.98 mg/dL (ref 0.61–1.24)
GFR calc Af Amer: >60 mL/min (ref >60)
GLUCOSE: 176 mg/dL — AB (ref 65–99)
Potassium: 3.9 mmol/L (ref 3.5–5.1)
Sodium: 136 mmol/L (ref 135–145)

## 2015-12-03 LAB — SEDIMENTATION RATE: Sed Rate: 15 mm/hr (ref 0–16)

## 2015-12-03 MED ORDER — DOXYCYCLINE HYCLATE 100 MG PO CAPS: 100.0000 mg | ORAL_CAPSULE | Freq: Two times a day (BID) | ORAL | Status: AC

## 2015-12-03 MED ORDER — GABAPENTIN 300 MG PO CAPS: 300.0000 mg | ORAL_CAPSULE | Freq: Three times a day (TID) | ORAL | Status: AC

## 2015-12-03 NOTE — ED Provider Notes (Signed)
CSN: [id number]     Arrival date & time 12/03/15  1033 History  By signing my name below, I, Phillis HaggisGabriella Gaje, attest that this documentation has been prepared under the direction and in the presence of Benjiman CoreNathan Kweku Stankey, MD. Electronically Signed: Phillis HaggisGabriella Gaje, ED Scribe. 12/03/2015. 11:07 AM.  Chief Complaint  Patient presents with  . Toe Pain   The history is provided by the patient. No language interpreter was used.  HPI Comments: Alexander Shannon is a 45 y.o. male with a hx of CAD and HTN who presents to the Emergency Department complaining of a gradually worsening wound to the left fifth toe onset 2 months ago. He reports associated intermittent drainage from the area. Pt was seen 2 weeks ago in the ED and prescribed anti-biotics. Pt's wound and stabbing pain have not gotten any better with anti-biotic use. He has additionally been using Neosporin and a Bandaid over the area. He reports worsening pain at night and with palpation. Pt states that the initial injury to the toe was in April after he hit the toe on a hot tub drain. This injury resulted in a hairline fracture of the toe. He denies hx of diabetes, fever, chills, cehest  nausea, vomiting, or diarrhea.   Past Medical History  Diagnosis Date  . Tobacco use disorder   . Obesity, unspecified   . Tietze's disease   . History of migraine headaches     Takes excedrin  . Dyslipidemia   . Essential hypertension, benign   . CAD (coronary artery disease) 07/12/2010        CARDIAC CATHETERIZATION,  Status post bare-metal stent to the circumflex   . Rash     Plavix, February, 2012  . Ejection fraction     EF 60-65%, echo, February, 2012  //   EF 60%, nuclear, July, 2013  . Dizziness     September, 2013, probable vertigo   Past Surgical History  Procedure Laterality Date  . Cardiac catheterization  07/13/2010    Occludded mid LCX, LAD: 30% proximal and 50% distal, RCA 30%. Normal EF by echo  . Coronary angioplasty  07/13/2010    Mid LCX:  3.0 X 18 mm Vision BME  . Left heart catheterization with coronary angiogram N/A 09/01/2013    Procedure: LEFT HEART CATHETERIZATION WITH CORONARY ANGIOGRAM;  Surgeon: Peter M SwazilandJordan, MD;  Location: [facility] CATH LAB;  Service: Cardiovascular;  Laterality: N/A;   Family History  Problem Relation Age of Onset  . Coronary artery disease Other     family history    Social History  Substance Use Topics  . Smoking status: Current Every Day Smoker -- 0.50 packs/day for 25 years    Types: Cigarettes    Start date: 03/04/1986  . Smokeless tobacco: Never Used     Comment: SMOKING CESSATION COUNSELING Started smoking age 45   . Alcohol Use: 0.0 oz/week    0 Standard drinks or equivalent per week     Comment: drinks beer on occasion    Review of Systems  Constitutional: Negative for fever and chills.  Respiratory: Negative for shortness of breath.   Cardiovascular: Negative for chest pain.  Gastrointestinal: Negative for nausea, vomiting and diarrhea.  Musculoskeletal: Positive for arthralgias.  Skin: Positive for wound.  All other systems reviewed and are negative.  Allergies  Plavix  Home Medications   Prior to Admission medications   Medication Sig Start Date End Date Taking? Authorizing Provider  acetaminophen (TYLENOL) 500 MG tablet Take 1,000  mg by mouth every 6 (six) hours as needed for mild pain.   Yes Historical Provider, MD  aspirin 81 MG tablet Take 81 mg by mouth daily.     Yes Historical Provider, MD  atorvastatin (LIPITOR) 40 MG tablet Take 1 tablet (40 mg total) by mouth daily. 10/28/14  Yes Antoine Poche, MD  carvedilol (COREG) 6.25 MG tablet Take 1 tablet (6.25 mg total) by mouth 2 (two) times daily. 06/17/14  Yes Antoine Poche, MD  Multiple Vitamin (MULTIVITAMIN WITH MINERALS) TABS tablet Take 1 tablet by mouth daily.   Yes Historical Provider, MD  naproxen (NAPROSYN) 250 MG tablet Take 250 mg by mouth 2 (two) times daily with a meal.   Yes Historical Provider, MD    nitroGLYCERIN (NITROSTAT) 0.4 MG SL tablet Place 1 tablet (0.4 mg total) under the tongue every 5 (five) minutes as needed for chest pain. 08/26/13  Yes Antoine Poche, MD  cephALEXin (KEFLEX) 500 MG capsule Take 1 capsule (500 mg total) by mouth 4 (four) times daily. Patient not taking: Reported on 12/03/2015 11/18/15   Donnetta Hutching, MD  doxycycline (VIBRAMYCIN) 100 MG capsule Take 1 capsule (100 mg total) by mouth 2 (two) times daily. 12/03/15   Benjiman Core, MD  gabapentin (NEURONTIN) 300 MG capsule Take 1 capsule (300 mg total) by mouth 3 (three) times daily. 12/03/15   Benjiman Core, MD  oxyCODONE-acetaminophen (PERCOCET) 5-325 MG tablet Take 1-2 tablets by mouth every 4 (four) hours as needed. Patient not taking: Reported on 12/03/2015 11/18/15   Donnetta Hutching, MD   BP 152/79 mmHg  Pulse 86  Temp(Src) 98.5 F (36.9 C) (Oral)  Resp 16  Ht  (1.651 m)  Wt 315 lb (142.883 kg)  BMI 52.42 kg/m2  SpO2 96% Physical Exam  Constitutional: He is oriented to person, place, and time. He appears well-developed and well-nourished.  HENT:  Head: Normocephalic and atraumatic.  Nasal congestion  Eyes: EOM are normal. Pupils are equal, round, and reactive to light.  Neck: Normal range of motion. Neck supple.  Cardiovascular: Normal rate, regular rhythm and normal heart sounds.  Exam reveals no gallop and no friction rub.   No murmur heard. Pulmonary/Chest: Effort normal and breath sounds normal. He has no wheezes.  Abdominal: Soft. There is no tenderness.  Musculoskeletal: Normal range of motion.  Strong DP pulses in left foot Tip of left fifth toe is discolored purple with central scab and induration, no exposed bone or drainage  Neurological: He is alert and oriented to person, place, and time.  Skin: Skin is warm and dry.  Psychiatric: He has a normal mood and affect. His behavior is normal.  Nursing note and vitals reviewed.     ED Course  Procedures (including critical care  time) DIAGNOSTIC STUDIES: Oxygen Saturation is 93% on RA, normal by my interpretation.    COORDINATION OF CARE: 11:05 AM-Discussed treatment plan which includes labs with pt at bedside and pt agreed to plan.    Labs Review Labs Reviewed  BASIC METABOLIC PANEL - Abnormal; Notable for the following:    Glucose, Bld 176 (*)    Calcium 8.3 (*)    All other components within normal limits  CBC WITH DIFFERENTIAL/PLATELET  SEDIMENTATION RATE    Imaging Review Dg Toe 5th Left  12/03/2015  CLINICAL DATA:  Fifth toe infection for 2 weeks, no known injury, initial encounter EXAM: DG TOE 5TH LEFT COMPARISON:  11/18/2015 FINDINGS: No definitive bony erosion is identified to  suggest osteomyelitis. Mild soft tissue swelling is seen consistent with the given clinical history. IMPRESSION: Soft tissue swelling without acute bony abnormality. Electronically Signed   By: Alcide CleverMark  Lukens M.D.   On: 12/03/2015 11:39   I have personally reviewed and evaluated these images and lab results as part of my medical decision-making.   EKG Interpretation None      MDM   Final diagnoses:  Toe infection  Neuropathy (HCC)    Patient with possible soft tissue infection of fifth toe. Does not appear to be clear abscess was time. X-ray did not show ostial. White count and sedimentation rate reassuring. Will give antibiotics and will follow-up with orthopedic. States he is attempting to get in to the orthopedic surgeon that is across the street from the hospital. We'll also start some Neurontin for possibly neuropathic pain in the foot.  I personally performed the services described in this documentation, which was scribed in my presence. The recorded information has been reviewed and is accurate.     Benjiman CoreNathan Meadow Abramo, MD 12/03/15 1329

## 2015-12-03 NOTE — ED Notes (Signed)
Pt c/o non healing area to left pinky toe x 2 weeks. Pt reports completing 2 courses po antibiotics with no improvement.

## 2015-12-03 NOTE — Discharge Instructions (Signed)
Cellulitis Cellulitis is an infection of the skin and the tissue beneath it. The infected area is usually red and tender. Cellulitis occurs most often in the arms and lower legs.  CAUSES  Cellulitis is caused by bacteria that enter the skin through cracks or cuts in the skin. The most common types of bacteria that cause cellulitis are staphylococci and streptococci. SIGNS AND SYMPTOMS   Redness and warmth.  Swelling.  Tenderness or pain.  Fever. DIAGNOSIS  Your health care provider can usually determine what is wrong based on a physical exam. Blood tests may also be done. TREATMENT  Treatment usually involves taking an antibiotic medicine. HOME CARE INSTRUCTIONS   Take your antibiotic medicine as directed by your health care provider. Finish the antibiotic even if you start to feel better.  Keep the infected arm or leg elevated to reduce swelling.  Apply a warm cloth to the affected area up to 4 times per day to relieve pain.  Take medicines only as directed by your health care provider.  Keep all follow-up visits as directed by your health care provider. SEEK MEDICAL CARE IF:   You notice red streaks coming from the infected area.  Your red area gets larger or turns dark in color.  Your bone or joint underneath the infected area becomes painful after the skin has healed.  Your infection returns in the same area or another area.  You notice a swollen bump in the infected area.  You develop new symptoms.  You have a fever. SEEK IMMEDIATE MEDICAL CARE IF:   You feel very sleepy.  You develop vomiting or diarrhea.  You have a general ill feeling (malaise) with muscle aches and pains.   This information is not intended to replace advice given to you by your health care provider. Make sure you discuss any questions you have with your health care provider.   Document Released: 02/27/2005 Document Revised: 02/08/2015 Document Reviewed: 08/05/2011 Elsevier Interactive  Patient Education 2016 Elsevier Inc.  Peripheral Neuropathy Peripheral neuropathy is a type of nerve damage. It affects nerves that carry signals between the spinal cord and other parts of the body. These are called peripheral nerves. With peripheral neuropathy, one nerve or a group of nerves may be damaged.  CAUSES  Many things can damage peripheral nerves. For some people with peripheral neuropathy, the cause is unknown. Some causes include:  Diabetes. This is the most common cause of peripheral neuropathy.  Injury to a nerve.  Pressure or stress on a nerve that lasts a long time.  Too little vitamin B. Alcoholism can lead to this.  Infections.  Autoimmune diseases, such as multiple sclerosis and systemic lupus erythematosus.  Inherited nerve diseases.  Some medicines, such as cancer drugs.  Toxic substances, such as lead and mercury.  Too little blood flowing to the legs.  Kidney disease.  Thyroid disease. SIGNS AND SYMPTOMS  Different people have different symptoms. The symptoms you have will depend on which of your nerves is damaged. Common symptoms include:  Loss of feeling (numbness) in the feet and hands.  Tingling in the feet and hands.  Pain that burns.  Very sensitive skin.  Weakness.  Not being able to move a part of the body (paralysis).  Muscle twitching.  Clumsiness or poor coordination.  Loss of balance.  Not being able to control your bladder.  Feeling dizzy.  Sexual problems. DIAGNOSIS  Peripheral neuropathy is a symptom, not a disease. Finding the cause of peripheral neuropathy can  be hard. To figure that out, your health care provider will take a medical history and do a physical exam. A neurological exam will also be done. This involves checking things affected by your brain, spinal cord, and nerves (nervous system). For example, your health care provider will check your reflexes, how you move, and what you can feel.  Other types of  tests may also be ordered, such as:  Blood tests.  A test of the fluid in your spinal cord.  Imaging tests, such as CT scans or an MRI.  Electromyography (EMG). This test checks the nerves that control muscles.  Nerve conduction velocity tests. These tests check how fast messages pass through your nerves.  Nerve biopsy. A small piece of nerve is removed. It is then checked under a microscope. TREATMENT   Medicine is often used to treat peripheral neuropathy. Medicines may include:  Pain-relieving medicines. Prescription or over-the-counter medicine may be suggested.  Antiseizure medicine. This may be used for pain.  Antidepressants. These also may help ease pain from neuropathy.  Lidocaine. This is a numbing medicine. You might wear a patch or be given a shot.  Mexiletine. This medicine is typically used to help control irregular heart rhythms.  Surgery. Surgery may be needed to relieve pressure on a nerve or to destroy a nerve that is causing pain.  Physical therapy to help movement.  Assistive devices to help movement. HOME CARE INSTRUCTIONS   Only take over-the-counter or prescription medicines as directed by your health care provider. Follow the instructions carefully for any given medicines. Do not take any other medicines without first getting approval from your health care provider.  If you have diabetes, work closely with your health care provider to keep your blood sugar under control.  If you have numbness in your feet:  Check every day for signs of injury or infection. Watch for redness, warmth, and swelling.  Wear padded socks and comfortable shoes. These help protect your feet.  Do not do things that put pressure on your damaged nerve.  Do not smoke. Smoking keeps blood from getting to damaged nerves.  Avoid or limit alcohol. Too much alcohol can cause a lack of B vitamins. These vitamins are needed for healthy nerves.  Develop a good support system.  Coping with peripheral neuropathy can be stressful. Talk to a mental health specialist or join a support group if you are struggling.  Follow up with your health care provider as directed. SEEK MEDICAL CARE IF:   You have new signs or symptoms of peripheral neuropathy.  You are struggling emotionally from dealing with peripheral neuropathy.  You have a fever. SEEK IMMEDIATE MEDICAL CARE IF:   You have an injury or infection that is not healing.  You feel very dizzy or begin vomiting.  You have chest pain.  You have trouble breathing.   This information is not intended to replace advice given to you by your health care provider. Make sure you discuss any questions you have with your health care provider.   Document Released: 05/10/2002 Document Revised: 01/30/2011 Document Reviewed: 01/25/2013 Elsevier Interactive Patient Education [employer] Elsevier Inc.

## 2016-02-27 ENCOUNTER — Telehealth: Payer: Self-pay | Admitting: Cardiology

## 2016-02-27 NOTE — Telephone Encounter (Signed)
Numerous attempts thru phone calls and reminder letters to patient.  Unable to reach patient.  [[id number]] 10/28/2014 9:25 AM New [10]    [System] 01/02/2015 11:03 PM Notification Sent [20]   Alexander Shannon [[id number]][[id number]] 04/26/2015 3:01 PM Notification Sent [20]   Alexander Shannon [[id number]][[id number]] 04/26/2015 3:04 PM Notification Sent [20]   Alexander Shannon [[id number]][[id number]] 10/04/2015 2:28 PM Notification Sent [20]   Alexander Shannon [[id number]][[id number]] 11/14/2015 3:15 PM Notification Sent [20]   Alexander Shannon [[id number]][[id number]] 02/14/2016 12:09 PM Notification Sent

## 2017-11-02 IMAGING — DX DG FOOT COMPLETE 3+V*L*
3 series · 3 of 3 positions shown · non-contrast
Comparison: Radiographs October 31, 2015.

CLINICAL DATA: Left foot infection.

EXAM:
LEFT FOOT - COMPLETE 3+ VIEW

[foot ap]
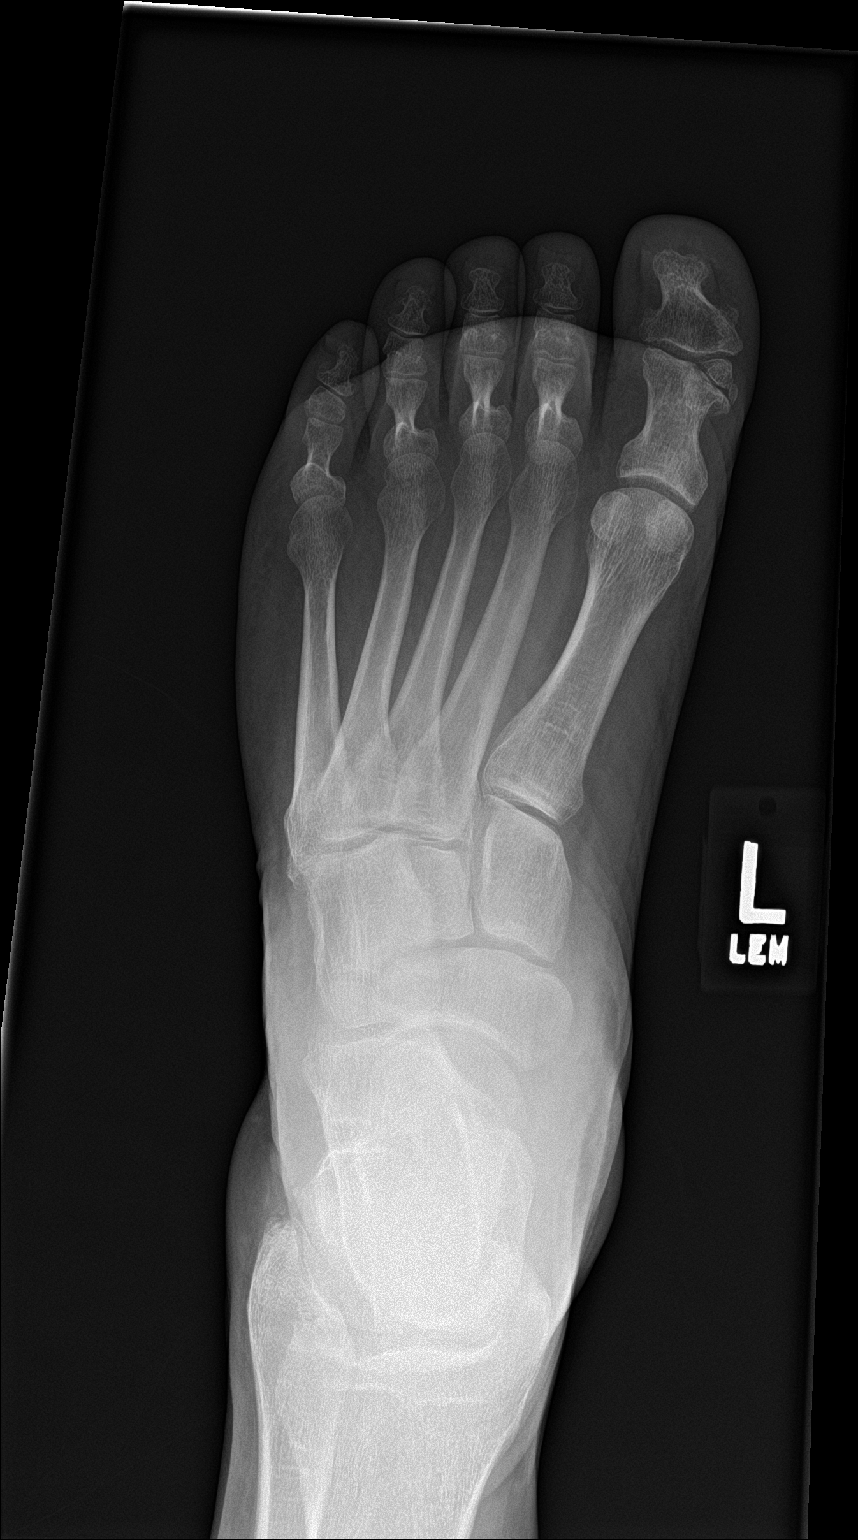

[foot obl]
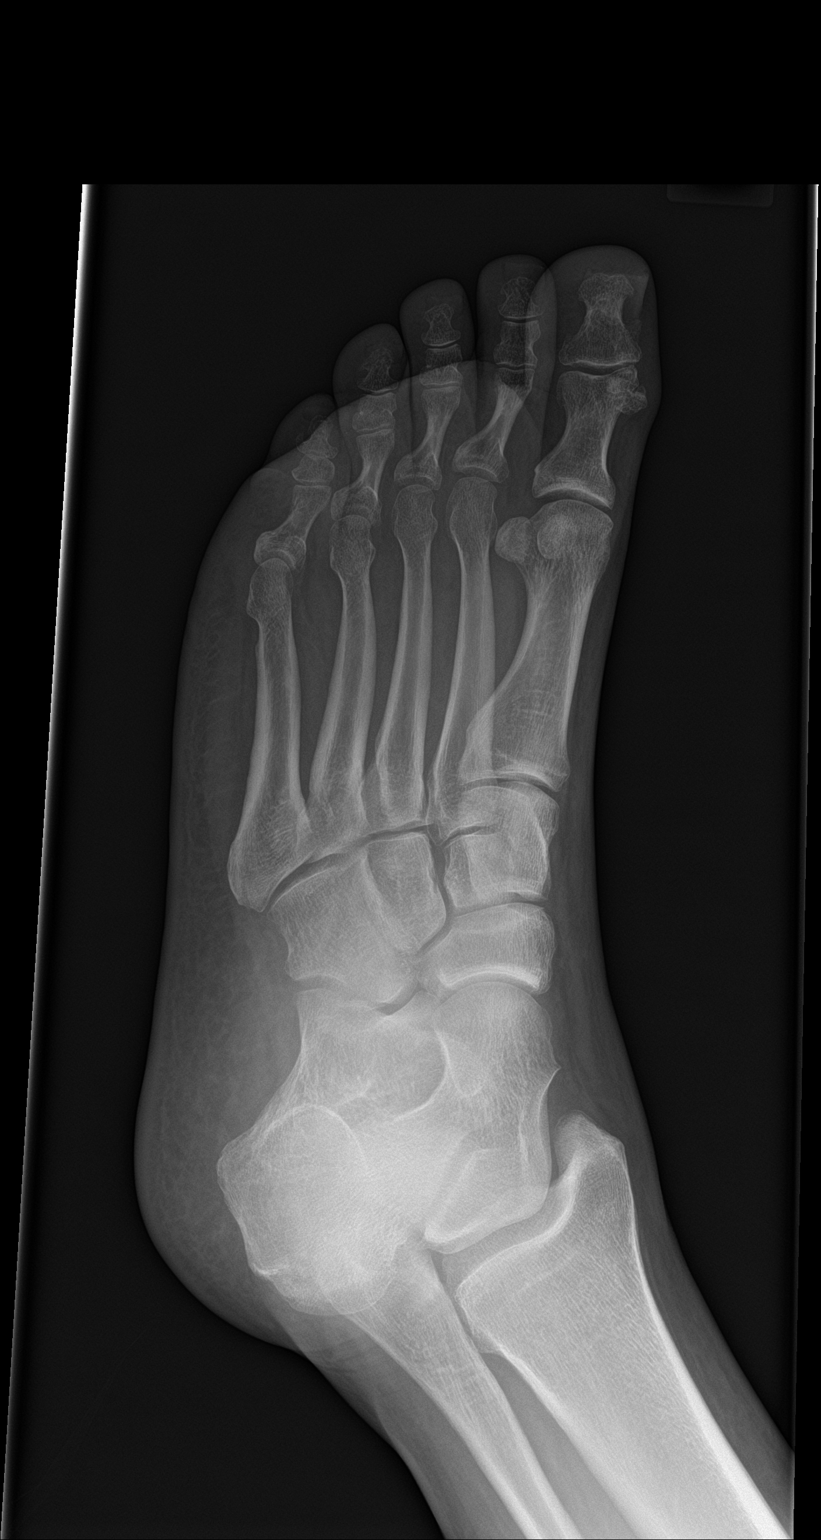

[foot lat]
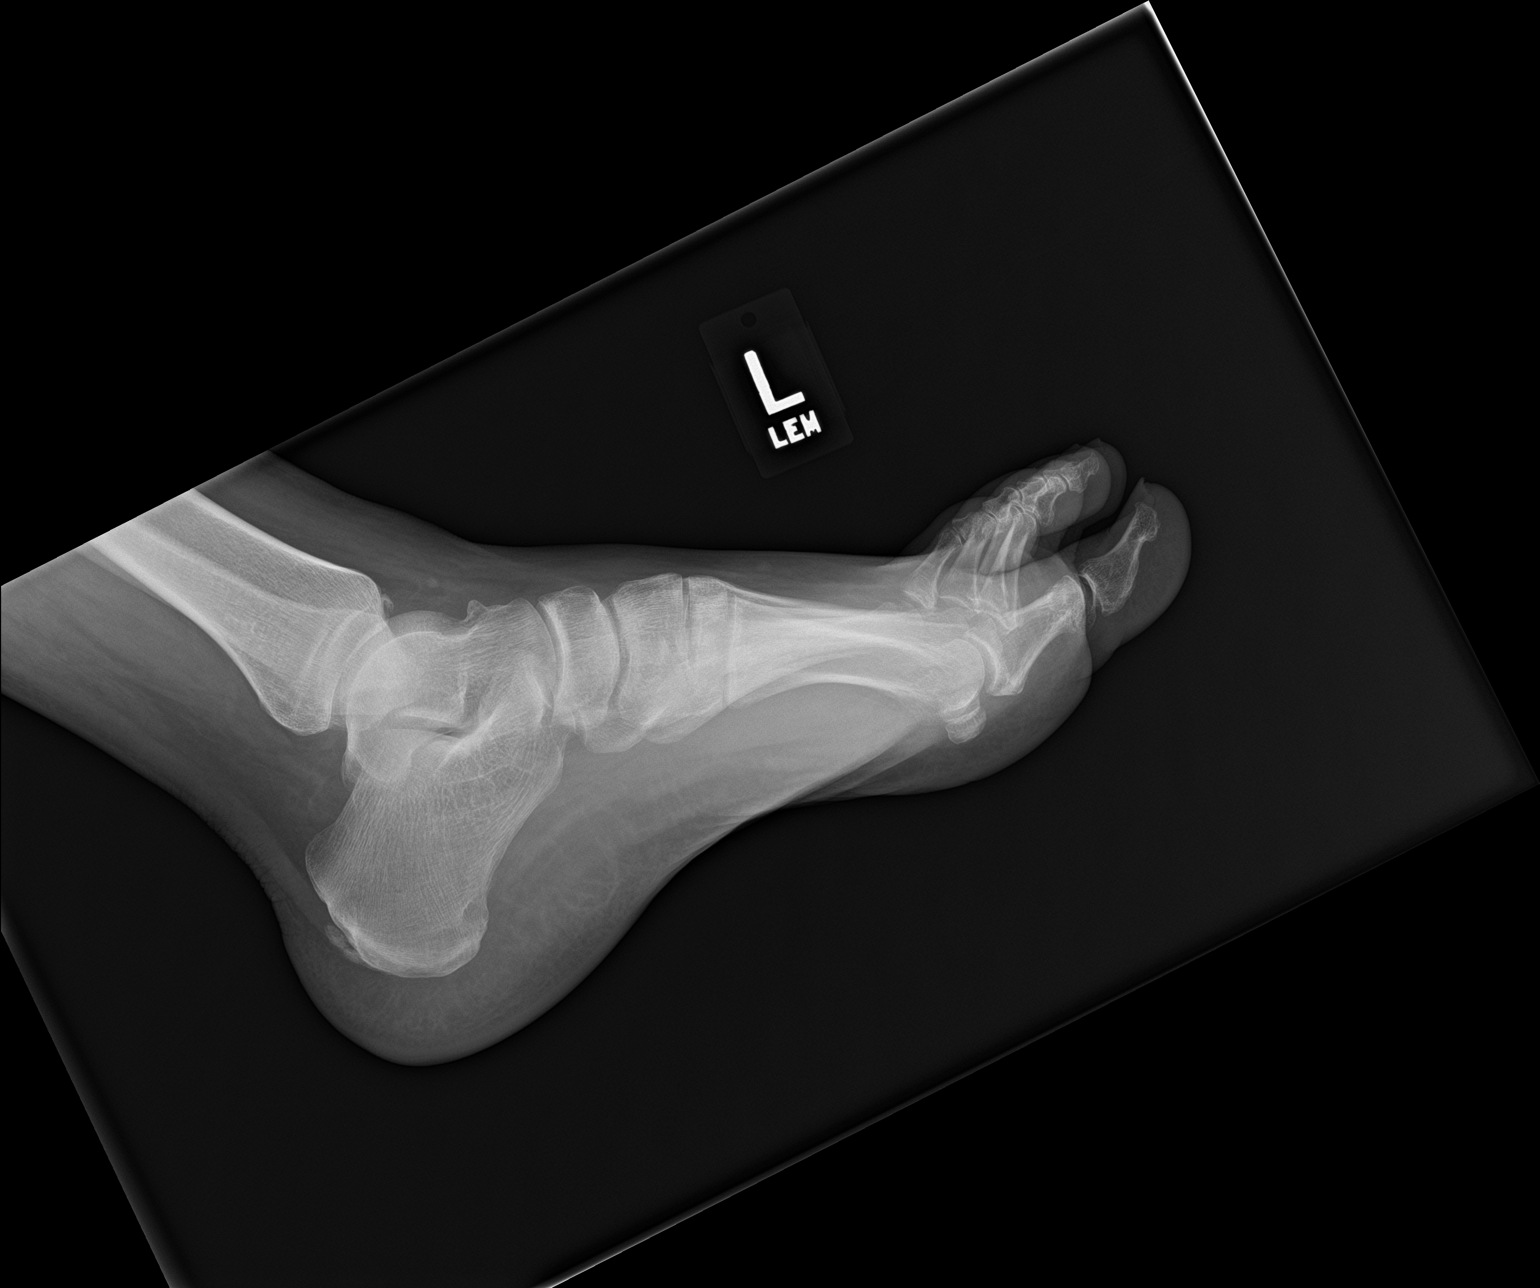

[3 of 3 positions shown; findings below may reference images not displayed]

FINDINGS: There is no evidence of fracture or dislocation. There is no
evidence of arthropathy or other focal bone abnormality. Soft
tissues are unremarkable.
IMPRESSION: Normal left foot.
